# Patient Record
Sex: Female | Born: 1985
Health system: Southern US, Community
[De-identification: ages and names within clinical notes are randomized; demographics above are authoritative.]

## PROBLEM LIST (undated history)

## (undated) DIAGNOSIS — E039 Hypothyroidism, unspecified: Secondary | ICD-10-CM

## (undated) DIAGNOSIS — E559 Vitamin D deficiency, unspecified: Secondary | ICD-10-CM

## (undated) DIAGNOSIS — A749 Chlamydial infection, unspecified: Secondary | ICD-10-CM

## (undated) DIAGNOSIS — D649 Anemia, unspecified: Secondary | ICD-10-CM

## (undated) DIAGNOSIS — N890 Mild vaginal dysplasia: Secondary | ICD-10-CM

## (undated) DIAGNOSIS — Z87411 Personal history of vaginal dysplasia: Secondary | ICD-10-CM

## (undated) DIAGNOSIS — E538 Deficiency of other specified B group vitamins: Secondary | ICD-10-CM

## (undated) DIAGNOSIS — A549 Gonococcal infection, unspecified: Secondary | ICD-10-CM

## (undated) DIAGNOSIS — Z8619 Personal history of other infectious and parasitic diseases: Secondary | ICD-10-CM

## (undated) DIAGNOSIS — Z8489 Family history of other specified conditions: Secondary | ICD-10-CM

## (undated) DIAGNOSIS — M25569 Pain in unspecified knee: Secondary | ICD-10-CM

## (undated) DIAGNOSIS — L301 Dyshidrosis [pompholyx]: Secondary | ICD-10-CM

## (undated) DIAGNOSIS — J3089 Other allergic rhinitis: Secondary | ICD-10-CM

## (undated) DIAGNOSIS — J302 Other seasonal allergic rhinitis: Secondary | ICD-10-CM

## (undated) HISTORY — DX: Hypothyroidism, unspecified: E03.9

## (undated) HISTORY — DX: Mild vaginal dysplasia: N89.0

## (undated) HISTORY — DX: Vitamin D deficiency, unspecified: E55.9

## (undated) HISTORY — DX: Deficiency of other specified B group vitamins: E53.8

## (undated) HISTORY — PX: REDUCTION MAMMAPLASTY: SUR839

## (undated) HISTORY — PX: ABDOMINOPLASTY: SUR9

## (undated) HISTORY — PX: CHOLECYSTECTOMY: SHX55

## (undated) HISTORY — DX: Dyshidrosis (pompholyx): L30.1

## (undated) HISTORY — DX: Gonococcal infection, unspecified: A54.9

## (undated) HISTORY — DX: Chlamydial infection, unspecified: A74.9

## (undated) HISTORY — DX: Pain in unspecified knee: M25.569

---
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
## 2005-04-29 ENCOUNTER — Other Ambulatory Visit: Admission: RE | Admit: 2005-04-29 | Discharge: 2005-04-29 | Payer: Self-pay | Admitting: Gynecology

## 2005-08-29 ENCOUNTER — Ambulatory Visit (HOSPITAL_BASED_OUTPATIENT_CLINIC_OR_DEPARTMENT_OTHER): Admission: RE | Admit: 2005-08-29 | Discharge: 2005-08-29 | Payer: Self-pay | Admitting: Gynecology

## 2005-12-16 ENCOUNTER — Other Ambulatory Visit: Admission: RE | Admit: 2005-12-16 | Discharge: 2005-12-16 | Payer: Self-pay | Admitting: Gynecology

## 2006-05-01 ENCOUNTER — Other Ambulatory Visit: Admission: RE | Admit: 2006-05-01 | Discharge: 2006-05-01 | Payer: Self-pay | Admitting: Gynecology

## 2006-07-16 ENCOUNTER — Inpatient Hospital Stay (HOSPITAL_COMMUNITY): Admission: AD | Admit: 2006-07-16 | Discharge: 2006-07-16 | Payer: Self-pay | Admitting: Gynecology

## 2006-12-01 ENCOUNTER — Inpatient Hospital Stay (HOSPITAL_COMMUNITY): Admission: AD | Admit: 2006-12-01 | Discharge: 2006-12-01 | Payer: Self-pay | Admitting: Obstetrics & Gynecology

## 2006-12-02 ENCOUNTER — Ambulatory Visit: Payer: Self-pay | Admitting: Obstetrics and Gynecology

## 2006-12-30 HISTORY — PX: OTHER SURGICAL HISTORY: SHX169

## 2007-06-20 ENCOUNTER — Emergency Department (HOSPITAL_COMMUNITY): Admission: EM | Admit: 2007-06-20 | Discharge: 2007-06-21 | Payer: Self-pay | Admitting: Emergency Medicine

## 2007-06-22 ENCOUNTER — Emergency Department (HOSPITAL_COMMUNITY): Admission: EM | Admit: 2007-06-22 | Discharge: 2007-06-22 | Payer: Self-pay | Admitting: Emergency Medicine

## 2007-08-05 ENCOUNTER — Other Ambulatory Visit: Admission: RE | Admit: 2007-08-05 | Discharge: 2007-08-05 | Payer: Self-pay | Admitting: Gynecology

## 2007-08-30 HISTORY — PX: OTHER SURGICAL HISTORY: SHX169

## 2008-08-10 ENCOUNTER — Other Ambulatory Visit: Admission: RE | Admit: 2008-08-10 | Discharge: 2008-08-10 | Payer: Self-pay | Admitting: Gynecology

## 2008-10-06 ENCOUNTER — Ambulatory Visit: Payer: Self-pay | Admitting: Women's Health

## 2008-11-01 ENCOUNTER — Ambulatory Visit: Payer: Self-pay | Admitting: Gynecology

## 2008-11-15 ENCOUNTER — Ambulatory Visit: Payer: Self-pay | Admitting: Gynecology

## 2008-12-30 HISTORY — PX: REDUCTION MAMMAPLASTY: SUR839

## 2009-02-03 ENCOUNTER — Ambulatory Visit: Payer: Self-pay | Admitting: Women's Health

## 2009-02-15 ENCOUNTER — Ambulatory Visit: Payer: Self-pay | Admitting: Women's Health

## 2009-03-20 ENCOUNTER — Encounter: Admission: RE | Admit: 2009-03-20 | Discharge: 2009-03-20 | Payer: Self-pay | Admitting: Family Medicine

## 2009-04-09 ENCOUNTER — Emergency Department (HOSPITAL_COMMUNITY): Admission: EM | Admit: 2009-04-09 | Discharge: 2009-04-09 | Payer: Self-pay | Admitting: Emergency Medicine

## 2009-04-23 ENCOUNTER — Emergency Department (HOSPITAL_COMMUNITY): Admission: EM | Admit: 2009-04-23 | Discharge: 2009-04-23 | Payer: Self-pay | Admitting: Emergency Medicine

## 2009-04-27 ENCOUNTER — Ambulatory Visit: Payer: Self-pay | Admitting: Gynecology

## 2009-05-17 ENCOUNTER — Encounter (INDEPENDENT_AMBULATORY_CARE_PROVIDER_SITE_OTHER): Payer: Self-pay | Admitting: General Surgery

## 2009-05-17 ENCOUNTER — Ambulatory Visit (HOSPITAL_COMMUNITY): Admission: RE | Admit: 2009-05-17 | Discharge: 2009-05-17 | Payer: Self-pay | Admitting: General Surgery

## 2009-05-17 HISTORY — PX: CHOLECYSTECTOMY, LAPAROSCOPIC: SHX56

## 2009-06-30 ENCOUNTER — Ambulatory Visit: Payer: Self-pay | Admitting: Gynecology

## 2009-09-25 ENCOUNTER — Ambulatory Visit: Payer: Self-pay | Admitting: Gynecology

## 2009-09-25 ENCOUNTER — Other Ambulatory Visit: Admission: RE | Admit: 2009-09-25 | Discharge: 2009-09-25 | Payer: Self-pay | Admitting: Gynecology

## 2009-09-25 ENCOUNTER — Encounter: Payer: Self-pay | Admitting: Gynecology

## 2009-11-08 ENCOUNTER — Ambulatory Visit: Payer: Self-pay | Admitting: Gynecology

## 2010-01-10 ENCOUNTER — Ambulatory Visit: Payer: Self-pay | Admitting: Women's Health

## 2010-04-11 ENCOUNTER — Ambulatory Visit: Payer: Self-pay | Admitting: Women's Health

## 2010-06-07 ENCOUNTER — Ambulatory Visit: Payer: Self-pay | Admitting: Women's Health

## 2010-07-13 ENCOUNTER — Ambulatory Visit: Payer: Self-pay | Admitting: Women's Health

## 2010-08-30 ENCOUNTER — Ambulatory Visit: Payer: Self-pay | Admitting: Women's Health

## 2010-12-13 ENCOUNTER — Other Ambulatory Visit
Admission: RE | Admit: 2010-12-13 | Discharge: 2010-12-13 | Payer: Self-pay | Source: Home / Self Care | Admitting: Gynecology

## 2010-12-13 ENCOUNTER — Ambulatory Visit: Payer: Self-pay | Admitting: Women's Health

## 2011-01-31 ENCOUNTER — Ambulatory Visit (INDEPENDENT_AMBULATORY_CARE_PROVIDER_SITE_OTHER): Payer: 59 | Admitting: Women's Health

## 2011-01-31 DIAGNOSIS — Z3049 Encounter for surveillance of other contraceptives: Secondary | ICD-10-CM

## 2011-02-21 ENCOUNTER — Ambulatory Visit (INDEPENDENT_AMBULATORY_CARE_PROVIDER_SITE_OTHER): Payer: 59 | Admitting: Gynecology

## 2011-02-21 DIAGNOSIS — Z3046 Encounter for surveillance of implantable subdermal contraceptive: Secondary | ICD-10-CM

## 2011-02-21 DIAGNOSIS — Z30431 Encounter for routine checking of intrauterine contraceptive device: Secondary | ICD-10-CM

## 2011-03-11 ENCOUNTER — Ambulatory Visit (INDEPENDENT_AMBULATORY_CARE_PROVIDER_SITE_OTHER): Payer: 59 | Admitting: Women's Health

## 2011-03-11 DIAGNOSIS — B373 Candidiasis of vulva and vagina: Secondary | ICD-10-CM

## 2011-03-11 DIAGNOSIS — N898 Other specified noninflammatory disorders of vagina: Secondary | ICD-10-CM

## 2011-03-21 ENCOUNTER — Ambulatory Visit: Payer: 59 | Admitting: Gynecology

## 2011-04-09 LAB — CBC
Hemoglobin: 13 g/dL (ref 12.0–15.0)
MCHC: 33.4 g/dL (ref 30.0–36.0)
RDW: 13.6 % (ref 11.5–15.5)
WBC: 7.1 10*3/uL (ref 4.0–10.5)

## 2011-04-09 LAB — COMPREHENSIVE METABOLIC PANEL
ALT: 15 U/L (ref 0–35)
AST: 19 U/L (ref 0–37)
Alkaline Phosphatase: 63 U/L (ref 39–117)
Calcium: 9.4 mg/dL (ref 8.4–10.5)
Chloride: 110 mEq/L (ref 96–112)
GFR calc Af Amer: >60 mL/min (ref >60)
GFR calc non Af Amer: >60 mL/min (ref >60)
Total Protein: 7.3 g/dL (ref 6.0–8.3)

## 2011-04-09 LAB — DIFFERENTIAL
Basophils Relative: 0 % (ref 0–1)
Eosinophils Absolute: 0.1 10*3/uL (ref 0.0–0.7)
Lymphocytes Relative: 31 % (ref 12–46)
Monocytes Absolute: 0.4 10*3/uL (ref 0.1–1.0)
Monocytes Relative: 6 % (ref 3–12)
Neutrophils Relative %: 60 % (ref 43–77)

## 2011-04-09 LAB — HCG, SERUM, QUALITATIVE: Preg, Serum: NEGATIVE

## 2011-04-10 LAB — URINE MICROSCOPIC-ADD ON

## 2011-04-10 LAB — POCT PREGNANCY, URINE: Preg Test, Ur: NEGATIVE

## 2011-04-10 LAB — COMPREHENSIVE METABOLIC PANEL
ALT: 16 U/L (ref 0–35)
ALT: 12 U/L (ref 0–35)
AST: 17 U/L (ref 0–37)
Alkaline Phosphatase: 80 U/L (ref 39–117)
Alkaline Phosphatase: 75 U/L (ref 39–117)
CO2: 26 mEq/L (ref 19–32)
CO2: 24 mEq/L (ref 19–32)
Calcium: 9.6 mg/dL (ref 8.4–10.5)
Chloride: 106 mEq/L (ref 96–112)
Chloride: 107 mEq/L (ref 96–112)
Creatinine, Ser: 0.54 mg/dL (ref 0.4–1.2)
GFR calc Af Amer: >60 mL/min (ref >60)
GFR calc Af Amer: >60 mL/min (ref >60)
GFR calc non Af Amer: >60 mL/min (ref >60)
GFR calc non Af Amer: >60 mL/min (ref >60)
Glucose, Bld: 93 mg/dL (ref 70–99)
Sodium: 142 mEq/L (ref 135–145)
Total Bilirubin: 0.6 mg/dL (ref 0.3–1.2)
Total Bilirubin: 0.5 mg/dL (ref 0.3–1.2)

## 2011-04-10 LAB — CBC
HCT: 41.7 % (ref 36.0–46.0)
Hemoglobin: 13.8 g/dL (ref 12.0–15.0)
MCHC: 33.1 g/dL (ref 30.0–36.0)
MCV: 84.3 fL (ref 78.0–100.0)
Platelets: 190 10*3/uL (ref 150–400)
RBC: 4.95 MIL/uL (ref 3.87–5.11)
WBC: 12 10*3/uL — ABNORMAL HIGH (ref 4.0–10.5)

## 2011-04-10 LAB — DIFFERENTIAL
Basophils Absolute: 0 10*3/uL (ref 0.0–0.1)
Basophils Relative: 0 % (ref 0–1)
Eosinophils Relative: 0 % (ref 0–5)
Lymphocytes Relative: 13 % (ref 12–46)
Monocytes Absolute: 0.7 10*3/uL (ref 0.1–1.0)
Monocytes Relative: 6 % (ref 3–12)

## 2011-04-10 LAB — URINALYSIS, ROUTINE W REFLEX MICROSCOPIC
Hgb urine dipstick: NEGATIVE
Ketones, ur: NEGATIVE mg/dL
Protein, ur: NEGATIVE mg/dL
Urobilinogen, UA: 0.2 mg/dL (ref 0.0–1.0)

## 2011-04-10 LAB — LIPASE, BLOOD: Lipase: 26 U/L (ref 11–59)

## 2011-05-14 NOTE — Op Note (Signed)
NAMESELIN, EISLER NO.:  [id number]   MEDICAL RECORD NO.:  [id number]          PATIENT TYPE:  AMB   LOCATION:  SDS                          FACILITY:  [facility]   PHYSICIAN:  Almond Lint, MD       DATE OF BIRTH:  Oct 10, 1986   DATE OF PROCEDURE:  05/17/2009  DATE OF DISCHARGE:                               OPERATIVE REPORT   PREOPERATIVE DIAGNOSIS:  Symptomatic cholelithiasis.   POSTOPERATIVE DIAGNOSIS:  Symptomatic cholelithiasis.   PROCEDURE PERFORMED:  Laparoscopic cholecystectomy with intraoperative  cholangiogram.   SURGEON:  Almond Lint, MD   ASSISTANT:  None.   ANESTHESIA:  General and local.   FINDINGS:  Adhesions in the gallbladder.  Normal cholangiogram.  Small  amount of gallbladder wall edema.   SPECIMENS:  Gallbladder to pathology.   ESTIMATED BLOOD LOSS:  Minimal.   COMPLICATIONS:  None known.   PROCEDURE IN DETAIL:  Ms. Schnelle was identified in the holding area  and taken to the operating room where she was placed supine on the  operating room table.  General anesthesia was induced.  The patient's  abdomen was prepped and draped in a sterile fashion.  The time-out was  performed according to the surgical safety check list.  When all was  correct we continued.  The infraumbilical skin was anesthetized using a  mixture of 1% lidocaine plain and 0.25% Marcaine with epinephrine.  A  curvilinear incision was made with an #11 blade beneath the umbilicus.  Subcutaneous skin was spread with the Kelly clamp.  A Kocher was used to  elevate the umbilical stalk.  The fascia was entered sharply with a #11  blade.  The peritoneum was not yet accessed as the posterior fascia  pulled away from the anterior fascia.  This was then elevated with  Kochers on either side of the midline and the subcutaneous tissue was  divided with Metzenbaum scissors.  The fascia was then entered in the  midline posteriorly and a Kelly clamp used to confirm entrance into  the  peritoneum.  The fascial incision had a purse-string suture placed  around it.  The Hasson was advanced into the peritoneal cavity and  pneumoperitoneum was achieved to a pressure of 15 mmHg.  The camera was  introduced into the abdomen and the area just under the Hasson was  examined closely for any signs of injury and there were none.  The  patient was then placed into reverse Trendelenburg position and rotated  to the left.  The epigastrium was anesthetized in the midline with local  anesthetic.  A 1.2 cm incision was made with #11 blade and a 11 port  placed under direct visualization to the right of falciform.  Two 5-mm  ports were placed in similar fashion in the right upper quadrant.  The  gallbladder fundus was retracted toward the head.  There were  significant omental adhesions to the gallbladder and these were taken  down with the [facility] dissector bluntly and with cautery.  The  gallbladder fundus was elevated more toward the head and the gallbladder  infundibulum retracted  laterally.  The cystic duct was skeletonized with  the [place].  The cystic artery appeared to overlie the  cystic duct exactly.  This was dissected free with a right angle clamp  and clipped and transected.  The cystic duct was slightly larger than  anticipated and so a cholangiogram was performed.   The gallbladder was clipped proximally and the duct was incised with the  Endo shears.  The catheter was advanced through the abdominal wall and  was attempted to be placed into the ductotomy.  Unfortunately, the angle  of the catheter prohibited placement into the cystic duct, so this was  placed more toward the head for a more direct course.  The catheter was  advanced into the ductotomy and clipped into place.  It was able to be  flushed.  The pneumoperitoneum was evacuated and the patient was turned  to supine position.  Cholangiogram was shot with a mixture of 50%  Hypaque.  This  demonstrated good filling of the right and left hepatic  ducts and filling of the common bile duct to the duodenum with no signs  of filling defects.  The patient was returned to reverse Trendelenburg  position and rotated to the left and pneumoperitoneum was retrieved.  The catheter was removed from the duct and the cystic duct was clipped 3  times on the patient's side and once on the specimen side.   There was residual vascular structure higher up toward the liver and  this was dissected free and left in place.  The hook electrocautery was  used to remove the gallbladder from the gallbladder fossa.  Once this  was complete, the EndoCatch bag was placed through the umbilical  incision and the camera moved to the epigastric incision.  Given the  difficulty with placement of the Hasson into the abdomen this was left  in the bag until the gallbladder fossa was examined for hemostasis.  There was hemostasis and the small amount of clot that was there was  evacuated.  Clips were examined.  There was no sign of bleeding or  biliary leakage.  The attention was then redirected to the Hasson and  the Hasson was removed along with the EndoCatch bag with the  gallbladder.  Under direct visualization, the umbilical port was closed.  There was no air leakage from this site and the fascia appeared to be  intact with no additional defect.  The right upper quadrant ports were  removed under direct visualization and the pneumoperitoneum was allowed  to evacuate through the epigastric and the port.  Once the  pneumoperitoneum had evacuated completely, the epigastric port was  removed.  The incisions were then closed using 4-0 Monocryl in a  subcuticular fashion.  The patient tolerated the procedure well.  Needle  and sponge counts were correct x2.  The wounds were then cleaned, dried,  and dressed with Dermabond.  The patient was taken to PACU in stable  condition.      Almond Lint, MD   Electronically Signed     FB/MEDQ  D:  05/17/2009  T:  05/17/2009  Job:  [id number]

## 2011-05-17 NOTE — H&P (Signed)
[name], Morrow NO.:  [id number]   MEDICAL RECORD NO.:  [id number]          PATIENT TYPE:  AMB   LOCATION:  NESC                         FACILITY:  [facility]   PHYSICIAN:  Juan H. Lily Peer, M.D.DATE OF BIRTH:  1986-06-13   DATE OF ADMISSION:  08/29/2005  DATE OF DISCHARGE:                                HISTORY & PHYSICAL   Patient is scheduled for surgery tomorrow, August 31, at 8:00 a.m. at [facility].   CHIEF COMPLAINT:  1.  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  2.  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   HISTORY:  The patient is an 25 year old who was seen for her annual  gynecological examination on May 1, and her Pap smear had demonstrated that  she had CIN I/VAIN I with HPV changes in a few cells.  She subsequently  underwent detailed culposcopic evaluation on July 25th, which had  demonstrated that the ectocervix at the 11 o'clock position and 1 o'clock  position respectively had CIN I.  Also, the right vaginal fornix was  biopsied for a suspicious lesion, and the path report was VAIN I.  The ECC  was benign.  The patient has been presented with different options, and she  will be taken to the operating room for an outpatient setting under  anesthesia to be able to do a laser ablation of the cervical and vaginal  dysplasia.   PAST MEDICAL HISTORY:  The patient is not taking any medications right now.  She had been on Depo-Provera contraception in the past and then also the  Ortho-Ever patch but does not use any form of contraception.   Her blood work on August 28th demonstrated a negative serum pregnancy test.  She had a normal TSH, which was done because she had skipped some periods.  Her urinalysis had demonstrated 100,000 pure colony-count staph, and she was  started on Macrobid b.i.d. for a 7 day course.   The patient denies any allergies.  No other medical problems.   PHYSICAL EXAMINATION:  VITAL SIGNS:   Patient weighs 176 pounds.  Height 5  feet tall.  Blood pressure 120/78.  HEENT:  Unremarkable.  NECK:  Supple.  Trachea is midline.  No carotid bruits.  No thyromegaly.  LUNGS:  Clear to auscultation with no rales, rhonchi or wheezes.  HEART:  Regular rate and rhythm with no murmurs or gallops.  BREASTS:  Not done.  ABDOMEN:  Soft and nontender with no rebound or guarding.  PELVIC:  Bartholin's, urethra, and Skene's glands within normal limits.  Vagina and cervix as described above.  EXTREMITIES:  No cords.  No edema.   ASSESSMENT:  An 25 year old gravida 0 with a history of an abnormal Pap  smear.  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  Benign endocervical  mucosa.  Patient was counseled as to  the risks, benefits and pro's and con's  of laser surgery, the risks of infection, trauma as a result of the laser  beam were discussed.  The patient understands that she will need to be  followed up in the office for a two weeks postop visit and then three months  later for a follow-up Pap smear and perhaps every six months for a few years  to make sure there is no recurrence of her dysplasia.  Also, on follow-up  Pap smear, we will do a hypercapture of the HPV virus.  We had also  discussed above the HPV vaccine, and she was to let me know when we see her  in the hospital or when she comes for a postop visit, so that she can  receive it.   PLAN:  As per assessment above.      Juan H. Lily Peer, M.D.  Electronically Signed     JHF/MEDQ  D:  08/28/2005  T:  08/28/2005  Job:  [id number]

## 2011-05-17 NOTE — Op Note (Signed)
NAMESELICIA, [name] NO.:  [id number]   MEDICAL RECORD NO.:  [id number]          PATIENT TYPE:  AMB   LOCATION:  NESC                         FACILITY:  [facility]   PHYSICIAN:  Juan H. Lily Peer, M.D.DATE OF BIRTH:  01-Nov-1986   DATE OF PROCEDURE:  08/29/2005  DATE OF DISCHARGE:                                 OPERATIVE REPORT   SURGEON:  Juan H. Lily Peer, M.D.   INDICATIONS FOR OPERATION:  An 25 year old with CIN 1 of the ectocervix at  the 11 and 1 o'clock position and also VAIN 1 of the right vaginal fornix   PREOPERATIVE DIAGNOSIS:  1.  CIN 1 of the ectocervix.  2.  VAIN 1 of the right vaginal fornix.   POSTOPERATIVE DIAGNOSES:  1.  CIN  1 of the ectocervix.  2.  VAIN 1 of the right vaginal fornix.   ANESTHESIA:  MAC and intravenous sedation.   FINDINGS:  As described above.   DESCRIPTION OF OPERATION:  After the patient was adequately counseled, she  was taken to the operating room where she received intravenous sedation. A  coated titanium speculum was inserted into the vaginal vault and the vagina,  cervix and fornices were cleansed with acetic acid solution for enhancement  of the previously seen lesions in the office where the colposcopic directed  biopsies had been made. With the CO2 laser set at 8 watts continuous mode, a  circumferential marking of the area to be lasered in the ectocervix was  delineated. In a brush like fashion, the ectocervix was ablated to the level  of 2-3 mm in depth and bypassing the margins of the area that were  previously biopsied on the ectocervix. The right vaginal fornix from 9 to 12  o'clock was ablated in a similar fashion in a brush-like technique but to a  depth of about 2 mm. After completion of the operation, Silvadene cream was  applied intravaginally, the patient was transferred to the recovery room  with stable vital signs. She was given Toradol 30 mg IV. Blood loss was  minimal and fluid resuscitation  consisted of 900 mL of lactated Ringer's.      Juan H. Lily Peer, M.D.  Electronically Signed     JHF/MEDQ  D:  08/29/2005  T:  08/29/2005  Job:  [id number]

## 2011-07-18 ENCOUNTER — Ambulatory Visit (INDEPENDENT_AMBULATORY_CARE_PROVIDER_SITE_OTHER): Payer: 59 | Admitting: Women's Health

## 2011-07-18 DIAGNOSIS — R82998 Other abnormal findings in urine: Secondary | ICD-10-CM

## 2011-07-18 DIAGNOSIS — N390 Urinary tract infection, site not specified: Secondary | ICD-10-CM

## 2011-12-18 ENCOUNTER — Ambulatory Visit (INDEPENDENT_AMBULATORY_CARE_PROVIDER_SITE_OTHER): Payer: 59 | Admitting: Women's Health

## 2011-12-18 ENCOUNTER — Other Ambulatory Visit (HOSPITAL_COMMUNITY)
Admission: RE | Admit: 2011-12-18 | Discharge: 2011-12-18 | Disposition: A | Discharge status: Home / Self Care | Payer: 59 | Source: Ambulatory Visit | Attending: Women's Health | Admitting: Women's Health

## 2011-12-18 ENCOUNTER — Encounter: Payer: Self-pay | Admitting: Women's Health

## 2011-12-18 VITALS — BP 110/72 | Ht 63.0 in | Wt 176.0 lb

## 2011-12-18 DIAGNOSIS — Z01419 Encounter for gynecological examination (general) (routine) without abnormal findings: Secondary | ICD-10-CM

## 2011-12-18 DIAGNOSIS — Z833 Family history of diabetes mellitus: Secondary | ICD-10-CM

## 2011-12-18 DIAGNOSIS — R823 Hemoglobinuria: Secondary | ICD-10-CM

## 2011-12-18 NOTE — Progress Notes (Signed)
Brittany Morrow 24-Jun-1986 [id number]    History:    The patient presents for annual exam. Mirena IUD, 5 day cycle every 1-2 months. No complaints. Gardasil completed in 07. History of CIN-1 with laser 2006, normal Paps after.  Past medical history, past surgical history, family history and social history were all reviewed and documented in the EPIC chart. History of positive Chlamydia and 02 and 05. Father died of complications of diabetes at age 25.   ROS:  A  ROS was performed and pertinent positives and negatives are included in the history.  Exam:  Filed Vitals:   12/18/11 0942  BP: 110/72    General appearance:  Normal Head/Neck:  Normal, without cervical or supraclavicular adenopathy. Thyroid:  Symmetrical, normal in size, without palpable masses or nodularity. Respiratory  Effort:  Normal  Auscultation:  Clear without wheezing or rhonchi Cardiovascular  Auscultation:  Regular rate, without rubs, murmurs or gallops  Edema/varicosities:  Not grossly evident Abdominal  Soft,nontender, without masses, guarding or rebound.  Liver/spleen:  No organomegaly noted  Hernia:  None appreciated  Skin  Inspection:  Grossly normal  Palpation:  Grossly normal Neurologic/psychiatric  Orientation:  Normal with appropriate conversation.  Mood/affect:  Normal  Genitourinary    Breasts: Examined lying and sitting.     Right: Without masses, retractions, discharge or axillary adenopathy.     Left: Without masses, retractions, discharge or axillary adenopathy.   Inguinal/mons:  Normal without inguinal adenopathy  External genitalia:  Normal  BUS/Urethra/Skene's glands:  Normal  Bladder:  Normal  Vagina:  Normal  Cervix:  Normal/IUD string visible  Uterus:   normal in size, shape and contour.  Midline and mobile  Adnexa/parametria:     Rt: Without masses or tenderness.   Lt: Without masses or tenderness.  Anus and perineum: Normal  Digital rectal exam: Normal sphincter tone  without palpated masses or tenderness  Assessment/Plan:  25 y.o. SHF G1P1 for annual exam.  History of CIN-1/laser treatment 2006/ normal Paps after. Mirena IUD placed 01/2011   Plan: CBC, glucose, UA and Pap. SBEs, exercise, MVI daily encouraged. Aware IUD is good for 5 years. Is not planning on of pregnancy at this time.    Harrington Challenger [facility], 2:23 PM 12/18/2011

## 2011-12-19 LAB — GLUCOSE, RANDOM: Glucose, Bld: 88 mg/dL (ref 70–99)

## 2012-07-31 ENCOUNTER — Encounter: Payer: Self-pay | Admitting: Women's Health

## 2012-07-31 ENCOUNTER — Ambulatory Visit (INDEPENDENT_AMBULATORY_CARE_PROVIDER_SITE_OTHER): Payer: 59 | Admitting: Women's Health

## 2012-07-31 DIAGNOSIS — N760 Acute vaginitis: Secondary | ICD-10-CM

## 2012-07-31 DIAGNOSIS — B9689 Other specified bacterial agents as the cause of diseases classified elsewhere: Secondary | ICD-10-CM

## 2012-07-31 DIAGNOSIS — A499 Bacterial infection, unspecified: Secondary | ICD-10-CM

## 2012-07-31 DIAGNOSIS — Z87410 Personal history of cervical dysplasia: Secondary | ICD-10-CM

## 2012-07-31 DIAGNOSIS — N898 Other specified noninflammatory disorders of vagina: Secondary | ICD-10-CM

## 2012-07-31 DIAGNOSIS — N870 Mild cervical dysplasia: Secondary | ICD-10-CM

## 2012-07-31 HISTORY — DX: Personal history of cervical dysplasia: Z87.410

## 2012-07-31 LAB — WET PREP FOR TRICH, YEAST, CLUE: Yeast Wet Prep HPF POC: NONE SEEN

## 2012-07-31 MED ORDER — METRONIDAZOLE 500 MG PO TABS: 500.0000 mg | ORAL_TABLET | Freq: Two times a day (BID) | ORAL | Status: AC

## 2012-07-31 NOTE — Patient Instructions (Addendum)
Bacterial Vaginosis Bacterial vaginosis (BV) is a vaginal infection where the normal balance of bacteria in the vagina is disrupted. The normal balance is then replaced by an overgrowth of certain bacteria. There are several different kinds of bacteria that can cause BV. BV is the most common vaginal infection in women of childbearing age. CAUSES   The cause of BV is not fully understood. BV develops when there is an increase or imbalance of harmful bacteria.   Some activities or behaviors can upset the normal balance of bacteria in the vagina and put women at increased risk including:   Having a new sex partner or multiple sex partners.   Douching.   Using an intrauterine device (IUD) for contraception.   It is not clear what role sexual activity plays in the development of BV. However, women that have never had sexual intercourse are rarely infected with BV.  Women do not get BV from toilet seats, bedding, swimming pools or from touching objects around them.  SYMPTOMS   Grey vaginal discharge.   A fish-like odor with discharge, especially after sexual intercourse.   Itching or burning of the vagina and vulva.   Burning or pain with urination.   Some women have no signs or symptoms at all.  DIAGNOSIS  Your caregiver must examine the vagina for signs of BV. Your caregiver will perform lab tests and look at the sample of vaginal fluid through a microscope. They will look for bacteria and abnormal cells (clue cells), a pH test higher than 4.5, and a positive amine test all associated with BV.  RISKS AND COMPLICATIONS   Pelvic inflammatory disease (PID).   Infections following gynecology surgery.   Developing HIV.   Developing herpes virus.  TREATMENT  Sometimes BV will clear up without treatment. However, all women with symptoms of BV should be treated to avoid complications, especially if gynecology surgery is planned. Female partners generally do not need to be treated. However,  BV may spread between female sex partners so treatment is helpful in preventing a recurrence of BV.   BV may be treated with antibiotics. The antibiotics come in either pill or vaginal cream forms. Either can be used with nonpregnant or pregnant women, but the recommended dosages differ. These antibiotics are not harmful to the baby.   BV can recur after treatment. If this happens, a second round of antibiotics will often be prescribed.   Treatment is important for pregnant women. If not treated, BV can cause a premature delivery, especially for a pregnant woman who had a premature birth in the past. All pregnant women who have symptoms of BV should be checked and treated.   For chronic reoccurrence of BV, treatment with a type of prescribed gel vaginally twice a week is helpful.  HOME CARE INSTRUCTIONS   Finish all medication as directed by your caregiver.   Do not have sex until treatment is completed.   Tell your sexual partner that you have a vaginal infection. They should see their caregiver and be treated if they have problems, such as a mild rash or itching.   Practice safe sex. Use condoms. Only have 1 sex partner.  PREVENTION  Basic prevention steps can help reduce the risk of upsetting the natural balance of bacteria in the vagina and developing BV:  Do not have sexual intercourse (be abstinent).   Do not douche.   Use all of the medicine prescribed for treatment of BV, even if the signs and symptoms go away.     Tell your sex partner if you have BV. That way, they can be treated, if needed, to prevent reoccurrence.  SEEK MEDICAL CARE IF:   Your symptoms are not improving after 3 days of treatment.   You have increased discharge, pain, or fever.  MAKE SURE YOU:   Understand these instructions.   Will watch your condition.   Will get help right away if you are not doing well or get worse.  FOR MORE INFORMATION  Division of STD Prevention (DSTDP), Centers for Disease  Control and Prevention: www.cdc.gov/std American Social Health Association (ASHA): www.ashastd.org  Document Released: 12/16/2005 Document Revised: 12/05/2011 Document Reviewed: 06/08/2009 ExitCare Patient Information 2012 [employer]. 

## 2012-07-31 NOTE — Progress Notes (Signed)
Patient ID: Brittany Morrow, female   DOB: [birth year], 26 y.o.   MRN: [id number] Presents with complaint of vaginal discharge with an odor intermittently for 2 weeks. Irregular cycles/ Mirena IUD that was placed 01/2011. Same partner, denies urinary symptoms, fever or other problems.  Exam: External genitalia within normal limits, speculum exam, IUD strings visible, moderate amount of a pink discharge noted with an odor. Wet prep positive for amines, clues and TNTC bacteria. Bimanual no CMT or adnexal fullness or tenderness.  BV  Plan: Flagyl 500 by mouth twice a day for 7 days #14, alcohol precautions reviewed. Instructed to call if no relief of symptoms.

## 2012-12-30 DIAGNOSIS — A549 Gonococcal infection, unspecified: Secondary | ICD-10-CM

## 2012-12-30 DIAGNOSIS — Z8619 Personal history of other infectious and parasitic diseases: Secondary | ICD-10-CM

## 2012-12-30 HISTORY — DX: Gonococcal infection, unspecified: A54.9

## 2012-12-30 HISTORY — DX: Personal history of other infectious and parasitic diseases: Z86.19

## 2013-08-06 ENCOUNTER — Encounter: Payer: Self-pay | Admitting: Gynecology

## 2013-08-06 ENCOUNTER — Ambulatory Visit (INDEPENDENT_AMBULATORY_CARE_PROVIDER_SITE_OTHER): Payer: 59 | Admitting: Gynecology

## 2013-08-06 VITALS — BP 122/76 | Ht 62.75 in | Wt 177.0 lb

## 2013-08-06 DIAGNOSIS — Z01419 Encounter for gynecological examination (general) (routine) without abnormal findings: Secondary | ICD-10-CM

## 2013-08-06 DIAGNOSIS — Z8639 Personal history of other endocrine, nutritional and metabolic disease: Secondary | ICD-10-CM

## 2013-08-06 DIAGNOSIS — R635 Abnormal weight gain: Secondary | ICD-10-CM

## 2013-08-06 DIAGNOSIS — Z833 Family history of diabetes mellitus: Secondary | ICD-10-CM

## 2013-08-06 DIAGNOSIS — N3281 Overactive bladder: Secondary | ICD-10-CM

## 2013-08-06 DIAGNOSIS — Z87410 Personal history of cervical dysplasia: Secondary | ICD-10-CM

## 2013-08-06 DIAGNOSIS — N318 Other neuromuscular dysfunction of bladder: Secondary | ICD-10-CM

## 2013-08-06 DIAGNOSIS — N898 Other specified noninflammatory disorders of vagina: Secondary | ICD-10-CM

## 2013-08-06 LAB — CBC WITH DIFFERENTIAL/PLATELET
Basophils Absolute: 0 10*3/uL (ref 0.0–0.1)
Eosinophils Relative: 1 % (ref 0–5)
Lymphocytes Relative: 25 % (ref 12–46)
MCV: 82.7 fL (ref 78.0–100.0)
Neutrophils Relative %: 68 % (ref 43–77)
Platelets: 232 10*3/uL (ref 150–400)
RDW: 14.4 % (ref 11.5–15.5)
WBC: 7.6 10*3/uL (ref 4.0–10.5)

## 2013-08-06 LAB — WET PREP FOR TRICH, YEAST, CLUE

## 2013-08-06 MED ORDER — METRONIDAZOLE 500 MG PO TABS: 500.0000 mg | ORAL_TABLET | Freq: Two times a day (BID) | ORAL | Status: DC

## 2013-08-06 NOTE — Patient Instructions (Addendum)
Bacterial Vaginosis Bacterial vaginosis (BV) is a vaginal infection where the normal balance of bacteria in the vagina is disrupted. The normal balance is then replaced by an overgrowth of certain bacteria. There are several different kinds of bacteria that can cause BV. BV is the most common vaginal infection in women of childbearing age. CAUSES   The cause of BV is not fully understood. BV develops when there is an increase or imbalance of harmful bacteria.  Some activities or behaviors can upset the normal balance of bacteria in the vagina and put women at increased risk including:  Having a new sex partner or multiple sex partners.  Douching.  Using an intrauterine device (IUD) for contraception.  It is not clear what role sexual activity plays in the development of BV. However, women that have never had sexual intercourse are rarely infected with BV. Women do not get BV from toilet seats, bedding, swimming pools or from touching objects around them.  SYMPTOMS   Grey vaginal discharge.  A fish-like odor with discharge, especially after sexual intercourse.  Itching or burning of the vagina and vulva.  Burning or pain with urination.  Some women have no signs or symptoms at all. DIAGNOSIS  Your caregiver must examine the vagina for signs of BV. Your caregiver will perform lab tests and look at the sample of vaginal fluid through a microscope. They will look for bacteria and abnormal cells (clue cells), a pH test higher than 4.5, and a positive amine test all associated with BV.  RISKS AND COMPLICATIONS   Pelvic inflammatory disease (PID).  Infections following gynecology surgery.  Developing HIV.  Developing herpes virus. TREATMENT  Sometimes BV will clear up without treatment. However, all women with symptoms of BV should be treated to avoid complications, especially if gynecology surgery is planned. Female partners generally do not need to be treated. However, BV may spread  between female sex partners so treatment is helpful in preventing a recurrence of BV.   BV may be treated with antibiotics. The antibiotics come in either pill or vaginal cream forms. Either can be used with nonpregnant or pregnant women, but the recommended dosages differ. These antibiotics are not harmful to the baby.  BV can recur after treatment. If this happens, a second round of antibiotics will often be prescribed.  Treatment is important for pregnant women. If not treated, BV can cause a premature delivery, especially for a pregnant woman who had a premature birth in the past. All pregnant women who have symptoms of BV should be checked and treated.  For chronic reoccurrence of BV, treatment with a type of prescribed gel vaginally twice a week is helpful. HOME CARE INSTRUCTIONS   Finish all medication as directed by your caregiver.  Do not have sex until treatment is completed.  Tell your sexual partner that you have a vaginal infection. They should see their caregiver and be treated if they have problems, such as a mild rash or itching.  Practice safe sex. Use condoms. Only have 1 sex partner. PREVENTION  Basic prevention steps can help reduce the risk of upsetting the natural balance of bacteria in the vagina and developing BV:  Do not have sexual intercourse (be abstinent).  Do not douche.  Use all of the medicine prescribed for treatment of BV, even if the signs and symptoms go away.  Tell your sex partner if you have BV. That way, they can be treated, if needed, to prevent reoccurrence. SEEK MEDICAL CARE IF:     Your symptoms are not improving after 3 days of treatment.  You have increased discharge, pain, or fever. MAKE SURE YOU:   Understand these instructions.  Will watch your condition.  Will get help right away if you are not doing well or get worse. FOR MORE INFORMATION  Division of STD Prevention (DSTDP), Centers for Disease Control and Prevention:  www.cdc.gov/std American Social Health Association (ASHA): www.ashastd.org  Document Released: 12/16/2005 Document Revised: 03/09/2012 Document Reviewed: 06/08/2009 ExitCare Patient Information 2014 [employer].  

## 2013-08-06 NOTE — Progress Notes (Signed)
Brittany Morrow 25-Dec-1986 [id number]   History:    27 y.o.  for annual gyn exam with complaint of vaginal discharge as well as frequency and urgency but no dysuria. Patient denies any back pain, fever, chills, nausea, or vomiting. Patient had a Mirena IUD placed in 2012 and has done well. She has completed the Gardasil vaccine series. 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 Patient does her monthly breast exam. 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 She does have strong family history of diabetes. She also was complaining of increased weight despite attempts at exercising. She has had history of vitamin D deficiency in the past.  Past medical history,surgical history, family history and social history were all reviewed and documented in the EPIC chart.  Gynecologic History Patient's last menstrual period was 07/28/2013. Contraception: IUD Last Pap: 2013. Results were: normal Last mammogram: none indicated. Results were: none indicated  Obstetric History OB History   Grav Para Term Preterm Abortions TAB SAB Ect Mult Living   1 1        1      # Outc Date GA Lbr Len/2nd Wgt Sex Del Anes PTL Lv   1 PAR                ROS: A ROS was performed and pertinent positives and negatives are included in the history.  GENERAL: No fevers or chills. HEENT: No change in vision, no earache, sore throat or sinus congestion. NECK: No pain or stiffness. CARDIOVASCULAR: No chest pain or pressure. No palpitations. PULMONARY: No shortness of breath, cough or wheeze. GASTROINTESTINAL: No abdominal pain, nausea, vomiting or diarrhea, melena or bright red blood per rectum. GENITOURINARY: No urinary frequency, urgency, hesitancy or dysuria. MUSCULOSKELETAL: No joint or muscle pain, no back pain, no recent trauma. DERMATOLOGIC: No rash, no itching, no lesions. ENDOCRINE: No polyuria, polydipsia, no  heat or cold intolerance. No recent change in weight. HEMATOLOGICAL: No anemia or easy bruising or bleeding. NEUROLOGIC: No headache, seizures, numbness, tingling or weakness. PSYCHIATRIC: No depression, no loss of interest in normal activity or change in sleep pattern.   complaining of discharge with odor  Exam: chaperone present  BP 122/76  Ht 5' 2.75" (1.594 m)  Wt 177 lb (80.287 kg)  BMI 31.6 kg/m2  LMP 07/28/2013  Body mass index is 31.6 kg/(m^2).  General appearance : Well developed well nourished female. No acute distress HEENT: Neck supple, trachea midline, no carotid bruits, no thyroidmegaly Lungs: Clear to auscultation, no rhonchi or wheezes, or rib retractions  Heart: Regular rate and rhythm, no murmurs or gallops Breast:Examined in sitting and supine position were symmetrical in appearance, no palpable masses or tenderness,  no skin retraction, no nipple inversion, no nipple discharge, no skin discoloration, no axillary or supraclavicular lymphadenopathy Abdomen: no palpable masses or tenderness, no rebound or guarding Extremities: no edema or skin discoloration or tenderness  Pelvic:  Bartholin, Urethra, Skene Glands: Within normal limits             Vagina: No gross lesions or discharge: Clear fascia motor like discharge  Cervix: No gross lesions or discharge, IUD string seen  Uterus  anteverted, normal size, shape and consistency, non-tender and mobile  Adnexa  Without masses or tenderness  Anus and perineum  normal   Rectovaginal  normal sphincter tone without palpated masses or tenderness  Hemoccult that indicated   Wet prep: Pos Amine, many clue cells, moderate WBC, too numerous to count bacteria  Assessment/Plan:  27 y.o. female for annual exam with clinical evidence of bacterial vaginosis. Patient will be placed on Flagyl 500 mg one by mouth twice a day for 5 days. GC and chlamydia culture was obtained results pending at time of dictation. The  following labs were ordered today: CBC, hemoglobin A1c, TSH, vitamin D level, screening cholesterol, and urinalysis. No Pap smear done today the new guidelines were discussed.    Ok Edwards MD, 3:53 PM 08/06/2013

## 2013-08-07 LAB — URINALYSIS W MICROSCOPIC + REFLEX CULTURE
Bilirubin Urine: NEGATIVE
Glucose, UA: NEGATIVE mg/dL
Hgb urine dipstick: NEGATIVE
Leukocytes, UA: NEGATIVE
Protein, ur: NEGATIVE mg/dL

## 2013-08-07 LAB — HEMOGLOBIN A1C
Hgb A1c MFr Bld: 5.3 % (ref <5.7)
Mean Plasma Glucose: 105 mg/dL (ref <117)

## 2013-08-07 LAB — CHOLESTEROL, TOTAL: Cholesterol: 151 mg/dL (ref 0–200)

## 2013-08-08 LAB — GC/CHLAMYDIA PROBE AMP: GC Probe RNA: POSITIVE — AB

## 2013-08-09 ENCOUNTER — Other Ambulatory Visit: Payer: Self-pay | Admitting: Gynecology

## 2013-08-09 ENCOUNTER — Encounter: Payer: Self-pay | Admitting: Gynecology

## 2013-08-09 ENCOUNTER — Encounter: Payer: Self-pay | Admitting: Women's Health

## 2013-08-09 ENCOUNTER — Telehealth: Payer: Self-pay

## 2013-08-09 DIAGNOSIS — Z113 Encounter for screening for infections with a predominantly sexual mode of transmission: Secondary | ICD-10-CM

## 2013-08-09 MED ORDER — AZITHROMYCIN 500 MG PO TABS: 1000.0000 mg | ORAL_TABLET | Freq: Once | ORAL | Status: DC

## 2013-08-09 NOTE — Telephone Encounter (Signed)
After speaking with you Gearldine Bienenstock called me back and said you wanted me to call in another gram of Azithromycin for the patient.  I need to get the okay/order from you so that it is documented here that was your order.  Ok?

## 2013-08-09 NOTE — Telephone Encounter (Signed)
Azithromycin 2 grams PO. Thanks

## 2013-08-09 NOTE — Telephone Encounter (Signed)
Gearldine Bienenstock, [facility] Public Health Investigator called to say that what you have prescribed for patient (Azyithromycin one gram) will not treat gonorrhea. She said you either need to give her 250mg  of Rocephin IM or call in another Gram of Azithromycin to equal two grams total.  Please advise.

## 2013-08-10 ENCOUNTER — Other Ambulatory Visit: Payer: Self-pay | Admitting: Gynecology

## 2013-08-10 ENCOUNTER — Telehealth: Payer: Self-pay

## 2013-08-10 MED ORDER — AZITHROMYCIN 500 MG PO TABS: 1000.0000 mg | ORAL_TABLET | Freq: Once | ORAL | Status: DC

## 2013-08-10 NOTE — Telephone Encounter (Signed)
I called patient and informed her that Dr. Glenetta Hew wants her to have total 2 grams of Azithromycin.  She has already picked up the Rx we escribed yesterday.  I explained I had called another Rx in for her and she needs to pick it up and take it today.  I did apologize to her for any inconvenience caused to her. She was very understanding.

## 2013-08-10 NOTE — Telephone Encounter (Signed)
Patient informed of need for another gram. Rx escribed and Brandy at the [facility] Dept notified that patient advised and Rx in.

## 2013-09-07 ENCOUNTER — Ambulatory Visit: Payer: 59 | Admitting: Gynecology

## 2013-09-16 ENCOUNTER — Ambulatory Visit (INDEPENDENT_AMBULATORY_CARE_PROVIDER_SITE_OTHER): Payer: 59 | Admitting: Gynecology

## 2013-09-16 ENCOUNTER — Encounter: Payer: Self-pay | Admitting: Gynecology

## 2013-09-16 VITALS — BP 120/82

## 2013-09-16 DIAGNOSIS — Z113 Encounter for screening for infections with a predominantly sexual mode of transmission: Secondary | ICD-10-CM

## 2013-09-16 DIAGNOSIS — A5400 Gonococcal infection of lower genitourinary tract, unspecified: Secondary | ICD-10-CM

## 2013-09-16 DIAGNOSIS — A549 Gonococcal infection, unspecified: Secondary | ICD-10-CM | POA: Insufficient documentation

## 2013-09-16 LAB — WET PREP FOR TRICH, YEAST, CLUE
Clue Cells Wet Prep HPF POC: NONE SEEN
Trich, Wet Prep: NONE SEEN
Yeast Wet Prep HPF POC: NONE SEEN

## 2013-09-16 NOTE — Patient Instructions (Signed)
Gonorrhea Culture This is a test for Neisseria gonorrhoeae, the bacteria that causes the sexually transmitted disease gonorrhea. Gonorrhea is easily treated but can cause severe reproductive and other health problems if left untreated. A swab is used to get a sample of secretion or discharge from the infected area such as the cervix, urethra, penis, anus, or throat. A urine sample is used in some tests. Many caregivers will take a sample from more than one body site to increase the likelihood of finding the bacteria.  NORMAL FINDINGS Your culture should be negative. This means that this culture showed no growth for gonorrhea. Ranges for normal findings may vary among different laboratories and hospitals. You should always check with your doctor after having lab work or other tests done to discuss the meaning of your test results and whether your values are considered within normal limits. MEANING OF TEST  Your caregiver will go over the test results with you and discuss the importance and meaning of your results, as well as treatment options and the need for additional tests. OBTAINING THE TEST RESULTS It is your responsibility to obtain your test results. Ask the lab or department performing the test when and how you will get your results. Document Released: 01/17/2005 Document Revised: 03/09/2012 Document Reviewed: 11/26/2008 [facility] Patient Information 2014 [place], [place].  Gonorrhea, Females and Males Gonorrhea is an infection. Gonorrhea can be treated with medicines that kill germs (antibiotics). It is necessary that all your sexual partners also be tested for infection and possibly be treated.  CAUSES  Gonorrhea is caused by a germ (bacteria) called Neisseria gonorrhoeae. This infection is spread by sexual contact. The contact that spreads gonorrhea from person to person may be oral, anal, or genital sex. SYMPTOMS  Females A woman may have gonorrhea infection and no symptoms. The most  common symptoms are:  Pain in the lower abdomen.  Fever, with or without chills. When these are the most serious problems, the illness is commonly called pelvic inflammatory disease (PID). Other symptoms include:  Abnormal vaginal discharge.  Painful intercourse.  Burning or itching of the vagina or lips of the vagina.  Abnormal vaginal bleeding.  Pain when urinating. If the infection is spread by anal sex:  Irritation, pain, bleeding, or discharge from the rectum. If the infection is spread by oral sex with either a man or a woman:  Sore throat, fever, and swollen neck lymph glands. Other problems may include:  Long-lasting (chronic) pain in the lower abdomen during menstruation, intercourse, or at other times.  Inability to become pregnant.  Premature birth.  Passing the infection onto a newborn baby. This can cause an eye infection in the infant or more serious health problems. Males Less frequently than in women, men may have gonorrhea infection and no symptoms. The most common symptoms are:  Discharge from the penis.  Pain or burning during urination. If the infection is spread by anal sex:  Irritation, pain, bleeding, or discharge from the rectum. If the infection is spread by oral sex with either a man or a woman:  Sore throat, fever, and swollen neck lymph glands. DIAGNOSIS  Diagnosis is made by exam of the patient and checking a sample of discharge under a microscope for the presence of the bacteria. Discharge may be taken from the urethra, cervix, throat, or rectum. TREATMENT  It is important to diagnose and treat gonorrhea as soon as possible. This prevents damage to the female or female organs or harm to the newborn baby  of an infected woman.  Antibiotics are used to treat gonorrhea.  Your sex partners should also be examined and treated if needed.  Testing and treatment for other sexually transmitted diseases (STDs) may be done when you are diagnosed  with gonorrhea. Gonorrhea is an STD. You are at risk for other STDs, which are often transmitted around the same time as gonorrhea. These include:  Chlamydia.  Syphilis.  Trichomonas.  Human papillomavirus (HPV).  Human immunodeficiency virus (HIV).  If left untreated, PID can cause women to be unable to have children (sterile). To prevent sterility in females, it is important to be treated as soon as possible and finish all medicines. Unfortunately, sterility or pregnancy occurring outside the uterus (ectopic) may still occur in fully treated women. HOME CARE INSTRUCTIONS   Finish all medicine as prescribed. Incomplete treatment will put you at risk for continued infection.  Only take over-the-counter or prescription medicines for pain, discomfort, or fever as directed by your caregiver.  Do not have sex until treatment is completed, or as instructed by your caregiver.  Follow up with your caregiver as directed.  If you test positive for gonorrhea, inform your recent sexual partners. They may need an exam and treatment, even if they have no symptoms. They may need treatment even if they test negative for gonorrhea. Finding out the results of your test Not all test results are available during your visit. If your test results are not back during the visit, make an appointment with your caregiver to find out the results. Do not assume everything is normal if you have not heard from your caregiver or the medical facility. It is important for you to follow up on all of your test results. SEEK MEDICAL CARE IF:   You develop any bad reaction to the medicine you were prescribed. This may include:  Rash.  Nausea.  Vomiting.  Diarrhea.  You have an oral temperature above 102 F (38.9 C).  You have symptoms that do not improve, symptoms that get worse, or you develop increased pain. Males may get pain in the testicles and females may get increased abdominal pain. MAKE SURE YOU:    Understand these instructions.  Will watch your condition.  Will get help right away if you are not doing well or get worse. Document Released: 12/13/2000 Document Revised: 03/09/2012 Document Reviewed: 04/17/2010 [facility] Patient Information 2014 [place], [place].

## 2013-09-16 NOTE — Progress Notes (Signed)
The patient presented to the office today for test of cure due to the fact the time of her annual exam on August 8 she had a GC and Chlamydia culture obtained and her gonorrhea culture was positive. At that time she was treated for bacterial vaginosis because she was having a vaginal discharge. She was treated with azithromycin 1 g by mouth. Patient's partner has refused to be treated and they are separated. Patient states her partner states that he does not have any symptoms. Patient is asymptomatic otherwise today.  Exam: Brittany Morrow was within normal limits Vagina: Menstrual blood present Cervix: IUD string seen  Wet prep negative  GC and Chlamydia culture obtained resulting in time of this dictation  Assessment/plan: Patient recently treated for gonorrhea. Test of cure today results pending. Patient will stop by the lab to complete the STD screening consisting of the following: HIV, RPR, hep B and hep C. Once again she was reminded to instruct her partner to be treated.

## 2013-09-17 LAB — HEPATITIS C ANTIBODY: HCV Ab: NEGATIVE

## 2013-09-17 LAB — HIV ANTIBODY (ROUTINE TESTING W REFLEX): HIV: NONREACTIVE

## 2013-09-17 LAB — GC/CHLAMYDIA PROBE AMP
CT Probe RNA: NEGATIVE
GC Probe RNA: NEGATIVE

## 2013-09-23 ENCOUNTER — Ambulatory Visit (INDEPENDENT_AMBULATORY_CARE_PROVIDER_SITE_OTHER): Payer: 59 | Admitting: Women's Health

## 2013-09-23 ENCOUNTER — Encounter: Payer: Self-pay | Admitting: Women's Health

## 2013-09-23 DIAGNOSIS — R350 Frequency of micturition: Secondary | ICD-10-CM

## 2013-09-23 DIAGNOSIS — B9689 Other specified bacterial agents as the cause of diseases classified elsewhere: Secondary | ICD-10-CM

## 2013-09-23 DIAGNOSIS — N898 Other specified noninflammatory disorders of vagina: Secondary | ICD-10-CM

## 2013-09-23 DIAGNOSIS — IMO0001 Reserved for inherently not codable concepts without codable children: Secondary | ICD-10-CM

## 2013-09-23 DIAGNOSIS — N760 Acute vaginitis: Secondary | ICD-10-CM

## 2013-09-23 DIAGNOSIS — A499 Bacterial infection, unspecified: Secondary | ICD-10-CM

## 2013-09-23 LAB — WET PREP FOR TRICH, YEAST, CLUE: Yeast Wet Prep HPF POC: NONE SEEN

## 2013-09-23 MED ORDER — METRONIDAZOLE 0.75 % VA GEL: VAGINAL | Status: DC

## 2013-09-23 NOTE — Progress Notes (Signed)
Patient ID: Brittany Morrow, female   DOB: 21-Nov-1986, 27 y.o.   MRN: [id number] Presents with 3 days of vaginal discharge, odor, intermittent menstrual type cramping. Denies itching or urinary symptoms. Tested positive for GC on annual exam and was treated with 2g azithromycin. Negative test of cure one week ago 09/16/13. States that a few days after completing the treatment she began experiencing above symptoms. Partner states he was treated but she is unsure if he is telling the truth. Mirena/same partner for 10 years/father of 48 yo [place].  Exam: well appearing, normal external genitalia, speculum exam: Cervix without visible lesion, IUD strings noted .No erythema, adherent white discharge, wet prep: yeast none, amine positive, clue cells moderate, bacteria TNTC.  GC/Chlamydia culture taken. Bimanual no CMT or adnexal fullness or tenderness.  Bacterial vaginosis STD screen/recent gonorrhea infection, treated and negative test of cure 09/16/13.  Plan: Metronidazole gel 70 g once a day x5 days, proper use reviewed alcohol precautions. GC/Chlamydia culture pending. Recommended counseling.

## 2013-09-23 NOTE — Patient Instructions (Addendum)
Bacterial Vaginosis Bacterial vaginosis (BV) is a vaginal infection where the normal balance of bacteria in the vagina is disrupted. The normal balance is then replaced by an overgrowth of certain bacteria. There are several different kinds of bacteria that can cause BV. BV is the most common vaginal infection in women of childbearing age. CAUSES   The cause of BV is not fully understood. BV develops when there is an increase or imbalance of harmful bacteria.  Some activities or behaviors can upset the normal balance of bacteria in the vagina and put women at increased risk including:  Having a new sex partner or multiple sex partners.  Douching.  Using an intrauterine device (IUD) for contraception.  It is not clear what role sexual activity plays in the development of BV. However, women that have never had sexual intercourse are rarely infected with BV. Women do not get BV from toilet seats, bedding, swimming pools or from touching objects around them.  SYMPTOMS   Grey vaginal discharge.  A fish-like odor with discharge, especially after sexual intercourse.  Itching or burning of the vagina and vulva.  Burning or pain with urination.  Some women have no signs or symptoms at all. DIAGNOSIS  Your caregiver must examine the vagina for signs of BV. Your caregiver will perform lab tests and look at the sample of vaginal fluid through a microscope. They will look for bacteria and abnormal cells (clue cells), a pH test higher than 4.5, and a positive amine test all associated with BV.  RISKS AND COMPLICATIONS   Pelvic inflammatory disease (PID).  Infections following gynecology surgery.  Developing HIV.  Developing herpes virus. TREATMENT  Sometimes BV will clear up without treatment. However, all women with symptoms of BV should be treated to avoid complications, especially if gynecology surgery is planned. Female partners generally do not need to be treated. However, BV may spread  between female sex partners so treatment is helpful in preventing a recurrence of BV.   BV may be treated with antibiotics. The antibiotics come in either pill or vaginal cream forms. Either can be used with nonpregnant or pregnant women, but the recommended dosages differ. These antibiotics are not harmful to the baby.  BV can recur after treatment. If this happens, a second round of antibiotics will often be prescribed.  Treatment is important for pregnant women. If not treated, BV can cause a premature delivery, especially for a pregnant woman who had a premature birth in the past. All pregnant women who have symptoms of BV should be checked and treated.  For chronic reoccurrence of BV, treatment with a type of prescribed gel vaginally twice a week is helpful. HOME CARE INSTRUCTIONS   Finish all medication as directed by your caregiver.  Do not have sex until treatment is completed.  Tell your sexual partner that you have a vaginal infection. They should see their caregiver and be treated if they have problems, such as a mild rash or itching.  Practice safe sex. Use condoms. Only have 1 sex partner. PREVENTION  Basic prevention steps can help reduce the risk of upsetting the natural balance of bacteria in the vagina and developing BV:  Do not have sexual intercourse (be abstinent).  Do not douche.  Use all of the medicine prescribed for treatment of BV, even if the signs and symptoms go away.  Tell your sex partner if you have BV. That way, they can be treated, if needed, to prevent reoccurrence. SEEK MEDICAL CARE IF:     Your symptoms are not improving after 3 days of treatment.  You have increased discharge, pain, or fever. MAKE SURE YOU:   Understand these instructions.  Will watch your condition.  Will get help right away if you are not doing well or get worse. FOR MORE INFORMATION  Division of STD Prevention (DSTDP), Centers for Disease Control and Prevention:  www.cdc.gov/std American Social Health Association (ASHA): www.ashastd.org  Document Released: 12/16/2005 Document Revised: 03/09/2012 Document Reviewed: 06/08/2009 ExitCare Patient Information 2014 [employer].  

## 2013-09-24 LAB — URINALYSIS W MICROSCOPIC + REFLEX CULTURE
Crystals: NONE SEEN
Nitrite: NEGATIVE
Protein, ur: NEGATIVE mg/dL
Urobilinogen, UA: 0.2 mg/dL (ref 0.0–1.0)
pH: 6 (ref 5.0–8.0)

## 2013-09-24 LAB — GC/CHLAMYDIA PROBE AMP
CT Probe RNA: NEGATIVE
GC Probe RNA: NEGATIVE

## 2013-09-25 LAB — URINE CULTURE
Colony Count: NO GROWTH
Organism ID, Bacteria: NO GROWTH

## 2013-11-04 ENCOUNTER — Other Ambulatory Visit: Payer: Self-pay

## 2014-03-30 ENCOUNTER — Ambulatory Visit: Payer: 59 | Admitting: Gynecology

## 2014-10-31 ENCOUNTER — Encounter: Payer: Self-pay | Admitting: Women's Health

## 2015-07-22 ENCOUNTER — Telehealth: Payer: Self-pay | Admitting: Physician Assistant

## 2015-07-22 DIAGNOSIS — J029 Acute pharyngitis, unspecified: Secondary | ICD-10-CM

## 2015-07-22 NOTE — Progress Notes (Signed)
Based on what you shared with me it looks like you have a condition that should be evaluated in a face to face office visit.  This could be either a viral or bacterial (strep) pharyngitis. Usually with strep you have a fever > 100, sore throat, tonsilar adenopathy and aches. You need assessment in office with a strep test to make this diagnosis. Stay well hydrated and get plenty of rest. Alternate tylenol and ibuprofen if needed for sore throat or pain. Salt-water gargles and chloraseptic spray may be beneficial. Again, you will need to be seen for a throat examination and strep test before we can treat with antibiotics for strep throat.  If you are having a true medical emergency please call 911.  If you need an urgent face to face visit, [place] has four urgent care centers for your convenience.  [facility]  [phone] Get Driving Directions Find a Provider at this Location  [address] [place], [place] [address] . 8 am to 8 pm Monday-Friday . 9 am to 7 pm Saturday-Sunday  . [facility]  [phone] Get Driving Directions Find a Provider at this Location  [address] [place], [place] [address] . 8 am to 8 pm Monday-Friday . 9 am to 6 pm Saturday . 11 am to 6 pm Sunday   . [facility] at [facility]  [phone] Get Driving Directions  [address] [place], [place] [address] . 8 am to 8 pm Monday-Friday . 9 am to 4 pm Saturday-Sunday   . [facility] (a walk in primary care provider)  [phone]  Get Driving Directions Find a Provider at this Location  [address] [place], [place] [address] . 8 am to 8:30 pm Monday-Thursday . 8 am to 6 pm Friday . 8 am to 4 pm Saturday-Sunday   Your e-visit answers were reviewed by a board certified advanced clinical practitioner to complete your personal care plan.  Depending on the condition, your plan  could have included both over the counter or prescription medications.  You will get an e-mail in the next two days asking about your experience.  I hope that your e-visit has been valuable and will speed your recovery . Thank you for choosing an e-visit.

## 2015-12-27 DIAGNOSIS — E039 Hypothyroidism, unspecified: Secondary | ICD-10-CM | POA: Insufficient documentation

## 2016-01-02 MED FILL — LEVOTHYROXINE 50 MCG TABLET: 50 | 30 days supply | Qty: 30 | Fill #2

## 2016-01-04 MED FILL — FLUCONAZOLE 150 MG TABLET: 150 | 7 days supply | Qty: 3 | Fill #0

## 2016-01-28 ENCOUNTER — Telehealth: Payer: Self-pay | Admitting: Family

## 2016-01-28 DIAGNOSIS — B9689 Other specified bacterial agents as the cause of diseases classified elsewhere: Secondary | ICD-10-CM

## 2016-01-28 DIAGNOSIS — N760 Acute vaginitis: Secondary | ICD-10-CM

## 2016-01-28 DIAGNOSIS — B373 Candidiasis of vulva and vagina: Secondary | ICD-10-CM

## 2016-01-28 DIAGNOSIS — A499 Bacterial infection, unspecified: Secondary | ICD-10-CM

## 2016-01-28 DIAGNOSIS — B3731 Acute candidiasis of vulva and vagina: Secondary | ICD-10-CM

## 2016-01-28 MED ORDER — FLUCONAZOLE 150 MG PO TABS: 150.0000 mg | ORAL_TABLET | Freq: Once | ORAL | Status: DC

## 2016-01-28 MED ORDER — METRONIDAZOLE 500 MG PO TABS: 500.0000 mg | ORAL_TABLET | Freq: Two times a day (BID) | ORAL | Status: DC

## 2016-01-28 NOTE — Progress Notes (Signed)

## 2016-02-15 MED FILL — LEVOTHYROXINE 50 MCG TABLET: 50 | 30 days supply | Qty: 30 | Fill #3

## 2016-02-22 MED FILL — NORETHIN-ESTRAD-FERR 1-0.02: 1-20 | 84 days supply | Qty: 84 | Fill #3

## 2016-03-08 DIAGNOSIS — R102 Pelvic and perineal pain: Secondary | ICD-10-CM | POA: Diagnosis not present

## 2016-03-08 DIAGNOSIS — Z3200 Encounter for pregnancy test, result unknown: Secondary | ICD-10-CM | POA: Diagnosis not present

## 2016-03-08 DIAGNOSIS — R109 Unspecified abdominal pain: Secondary | ICD-10-CM | POA: Diagnosis not present

## 2016-03-08 DIAGNOSIS — N760 Acute vaginitis: Secondary | ICD-10-CM | POA: Diagnosis not present

## 2016-03-08 MED FILL — TINIDAZOLE 500 MG TABLET: 500 | 2 days supply | Qty: 8 | Fill #0

## 2016-04-05 DIAGNOSIS — R109 Unspecified abdominal pain: Secondary | ICD-10-CM | POA: Diagnosis not present

## 2016-04-05 DIAGNOSIS — R1013 Epigastric pain: Secondary | ICD-10-CM | POA: Diagnosis not present

## 2016-04-05 DIAGNOSIS — Z9049 Acquired absence of other specified parts of digestive tract: Secondary | ICD-10-CM | POA: Diagnosis not present

## 2016-04-08 MED FILL — OMEPRAZOLE DR 20 MG CAPSULE: 20 | 30 days supply | Qty: 30 | Fill #0

## 2016-04-08 MED FILL — LEVOTHYROXINE 50 MCG TABLET: 50 | 30 days supply | Qty: 30 | Fill #4

## 2016-05-16 MED FILL — NORETHIN-ESTRAD-FERR 1-0.02: 1-20 | 28 days supply | Qty: 28 | Fill #4

## 2016-05-23 DIAGNOSIS — Z114 Encounter for screening for human immunodeficiency virus [HIV]: Secondary | ICD-10-CM | POA: Diagnosis not present

## 2016-05-23 DIAGNOSIS — B373 Candidiasis of vulva and vagina: Secondary | ICD-10-CM | POA: Diagnosis not present

## 2016-05-23 DIAGNOSIS — Z01419 Encounter for gynecological examination (general) (routine) without abnormal findings: Secondary | ICD-10-CM | POA: Diagnosis not present

## 2016-05-23 DIAGNOSIS — Z113 Encounter for screening for infections with a predominantly sexual mode of transmission: Secondary | ICD-10-CM | POA: Diagnosis not present

## 2016-05-23 DIAGNOSIS — Z118 Encounter for screening for other infectious and parasitic diseases: Secondary | ICD-10-CM | POA: Diagnosis not present

## 2016-05-23 DIAGNOSIS — Z1159 Encounter for screening for other viral diseases: Secondary | ICD-10-CM | POA: Diagnosis not present

## 2016-05-23 DIAGNOSIS — Z6832 Body mass index (BMI) 32.0-32.9, adult: Secondary | ICD-10-CM | POA: Diagnosis not present

## 2016-05-23 MED FILL — LEVOTHYROXINE 50 MCG TABLET: 50 | 30 days supply | Qty: 30 | Fill #5

## 2016-05-23 MED FILL — TERCONAZOLE 0.4% VAG CREAM: 0.4 | 7 days supply | Qty: 45 | Fill #0

## 2016-05-29 MED FILL — AZITHROMYCIN 500 MG TABLET: 500 | 1 days supply | Qty: 2 | Fill #0

## 2016-06-13 MED FILL — NORETHIN-ESTRAD-FERR 1-0.02: 1-20 | 84 days supply | Qty: 84 | Fill #0

## 2016-06-18 DIAGNOSIS — N760 Acute vaginitis: Secondary | ICD-10-CM | POA: Diagnosis not present

## 2016-06-18 DIAGNOSIS — R350 Frequency of micturition: Secondary | ICD-10-CM | POA: Diagnosis not present

## 2016-06-18 MED FILL — TINIDAZOLE 500 MG TABLET: 500 | 2 days supply | Qty: 8 | Fill #0

## 2016-07-09 DIAGNOSIS — Z113 Encounter for screening for infections with a predominantly sexual mode of transmission: Secondary | ICD-10-CM | POA: Diagnosis not present

## 2016-07-09 DIAGNOSIS — A749 Chlamydial infection, unspecified: Secondary | ICD-10-CM | POA: Diagnosis not present

## 2016-07-23 ENCOUNTER — Ambulatory Visit (INDEPENDENT_AMBULATORY_CARE_PROVIDER_SITE_OTHER): Payer: 59

## 2016-07-23 ENCOUNTER — Ambulatory Visit (INDEPENDENT_AMBULATORY_CARE_PROVIDER_SITE_OTHER): Payer: 59 | Admitting: Emergency Medicine

## 2016-07-23 ENCOUNTER — Ambulatory Visit: Payer: 59

## 2016-07-23 VITALS — BP 124/82 | HR 70 | Temp 98.1°F | Resp 18 | Ht 62.75 in | Wt 179.0 lb

## 2016-07-23 DIAGNOSIS — M79644 Pain in right finger(s): Secondary | ICD-10-CM

## 2016-07-23 DIAGNOSIS — R358 Other polyuria: Secondary | ICD-10-CM | POA: Diagnosis not present

## 2016-07-23 DIAGNOSIS — R631 Polydipsia: Secondary | ICD-10-CM | POA: Diagnosis not present

## 2016-07-23 DIAGNOSIS — E038 Other specified hypothyroidism: Secondary | ICD-10-CM

## 2016-07-23 DIAGNOSIS — M7989 Other specified soft tissue disorders: Secondary | ICD-10-CM | POA: Diagnosis not present

## 2016-07-23 DIAGNOSIS — R5383 Other fatigue: Secondary | ICD-10-CM | POA: Diagnosis not present

## 2016-07-23 DIAGNOSIS — R3589 Other polyuria: Secondary | ICD-10-CM

## 2016-07-23 LAB — POCT CBC
Granulocyte percent: 65.2 % (ref 37–80)
HCT, POC: 38.1 % (ref 37.7–47.9)
Hemoglobin: 12.7 g/dL (ref 12.2–16.2)
Lymph, poc: 1.8 (ref 0.6–3.4)
MCH, POC: 28.3 pg (ref 27–31.2)
MCHC: 33.4 g/dL (ref 31.8–35.4)
MCV: 84.6 fL (ref 80–97)
MID (cbc): 0.4 (ref 0–0.9)
MPV: 9.1 fL (ref 0–99.8)
POC Granulocyte: 4 (ref 2–6.9)
POC LYMPH PERCENT: 28.7 % (ref 10–50)
POC MID %: 6.1 % (ref 0–12)
Platelet Count, POC: 192 10*3/uL (ref 142–424)
RBC: 4.5 M/uL (ref 4.04–5.48)
RDW, POC: 13.3 %
WBC: 6.1 10*3/uL (ref 4.6–10.2)

## 2016-07-23 LAB — POC MICROSCOPIC URINALYSIS (UMFC): MUCUS RE: ABSENT

## 2016-07-23 LAB — POCT URINALYSIS DIP (MANUAL ENTRY)
GLUCOSE UA: NEGATIVE
LEUKOCYTES UA: NEGATIVE
Nitrite, UA: NEGATIVE
Protein Ur, POC: 30 — AB
SPEC GRAV UA: 1.025
Urobilinogen, UA: 0.2
pH, UA: 5.5

## 2016-07-23 LAB — BASIC METABOLIC PANEL WITH GFR
BUN: 11 mg/dL (ref 7–25)
CO2: 23 mmol/L (ref 20–31)
Calcium: 8.9 mg/dL (ref 8.6–10.2)
Chloride: 109 mmol/L (ref 98–110)
Creat: 0.69 mg/dL (ref 0.50–1.10)
GFR, Est African American: >89 mL/min (ref >=60)
GFR, Est Non African American: >89 mL/min (ref >=60)
GLUCOSE: 87 mg/dL (ref 65–99)
POTASSIUM: 4.1 mmol/L (ref 3.5–5.3)
SODIUM: 143 mmol/L (ref 135–146)

## 2016-07-23 LAB — GLUCOSE, POCT (MANUAL RESULT ENTRY): POC Glucose: 82 mg/dL (ref 70–99)

## 2016-07-23 LAB — THYROID PANEL WITH TSH
Free Thyroxine Index: 2 (ref 1.4–3.8)
T3 Uptake: 23 % (ref 22–35)
T4, Total: 8.8 ug/dL (ref 4.5–12.0)
TSH: 8.18 m[IU]/L — ABNORMAL HIGH

## 2016-07-23 LAB — POCT GLYCOSYLATED HEMOGLOBIN (HGB A1C): Hemoglobin A1C: 5.4

## 2016-07-23 NOTE — Patient Instructions (Addendum)
Please do exercises using her right hand to improve your range of motion. I will call you with results of your thyroid studies to decide on what treatment is appropriate.    IF you received an x-ray today, you will receive an invoice from [facility]. Please contact [facility] at [phone] with questions or concerns regarding your invoice.   IF you received labwork today, you will receive an invoice from [employer]. Please contact [facility] at [phone] with questions or concerns regarding your invoice.   Our billing staff will not be able to assist you with questions regarding bills from these companies.  You will be contacted with the lab results as soon as they are available. The fastest way to get your results is to activate your My Chart account. Instructions are located on the last page of this paperwork. If you have not heard from [place] regarding the results in 2 weeks, please contact this office.

## 2016-07-23 NOTE — Progress Notes (Addendum)
By signing my name below, I, Mesha Guinyard, attest that this documentation has been prepared under the direction and in the presence of Lesle Chris, MD.  Electronically Signed: Arvilla Market, Medical Scribe. 07/23/16. 10:41 AM.  Chief Complaint:  Chief Complaint  Patient presents with  . Other    Pt requesting A1C and thyroid labs. States she was previously on thryroid medication.    HPI: Brittany Morrow is a 30 y.o. female who reports to [facility] today for blood work for thyroid and A1c levels. Pt was on Levothyroxine to manage her thyroid- pt has been taking them for 2 years. Pt hasn't been taking Levothyroxine for 2 months and she's begining to feel fatigue, and states her face is "puffy" in the morning when she wakes up. Pt reports her endocrinologist has retired and it's been difficult to get an appointment with that office. Pt takes Vit. D supplements and Junel for birth control. Pt denies possibility of current pregnancy.   A1c: Pt states she's been thirsty often, and having urinary frequency. Pt states she could drink water all day and still feel thirsty. Pt states she's getting up 3-4 times at night to urinate and she's peeing often in the day. Pt has a FHx of DM. Pt denies having any complications during her pregnancy. Pt denies dysuria, or odor in her urine.  Thumb Pain: Pt mentions her right thumbhas been swollen for a month. Pt states she woke up one morning and it was blue and purple. Pt reports the pain occasionally radiates to her index finger.   Past Medical History:  Diagnosis Date  . Dysplasia of cervix, low grade (CIN 1) 2006   LASER OF VAG AND CERVIX FOR CIN1 AND VAIN1  . Gonorrhea 2014   Positive culture  . STD (sexually transmitted disease) 09/2001, 12/2003   POSITIVE CHLAMYDIA  . Thyroid disease   . VAIN I (vaginal intraepithelial neoplasia grade I) 2006   LASER OF CERVIX AND VAG/ VAIN 1 AND CIN1  . Vitamin D deficiency    Past Surgical History:  Procedure  Laterality Date  . BREAST SURGERY  2008   REDUCTION MAMMOPLASTY  . CHOLECYSTECTOMY    . gonorrhea history     2014  . LASER VAPORIZATION OF CERVIX AND VAGINA  2008   [facility]--DR FERNANDEZ   Social History   Social History  . Marital status: Single    Spouse name: N/A  . Number of children: N/A  . Years of education: N/A   Social History Main Topics  . Smoking status: Never Smoker  . Smokeless tobacco: Never Used  . Alcohol use Yes     Comment: RARE-SOCIALLY ONLY  . Drug use: No  . Sexual activity: Yes    Birth control/ protection: IUD     Comment: MIRENA   Other Topics Concern  . None   Social History Narrative  . None   Family History  Problem Relation Age of Onset  . Diabetes Father   . Diabetes Paternal Grandmother   . Hypertension Mother    No Known Allergies Prior to Admission medications   Medication Sig Start Date End Date Taking? Authorizing Provider  cholecalciferol (VITAMIN D) 1000 UNITS tablet Take 1,000 Units by mouth daily.   Yes Historical Provider, MD  norethindrone-ethinyl estradiol (JUNEL FE,GILDESS FE,LOESTRIN FE) 1-20 MG-MCG tablet Take 1 tablet by mouth daily.   Yes Historical Provider, MD     ROS: The patient denies fevers, chills, night sweats, unintentional weight loss, chest  pain, palpitations, wheezing, dyspnea on exertion, nausea, vomiting, abdominal pain, dysuria, hematuria, melena, numbness, weakness, or tingling.   All other systems have been reviewed and were otherwise negative with the exception of those mentioned in the HPI and as above.    PHYSICAL EXAM: Vitals:   07/23/16 1022  BP: 124/82  Pulse: 70  Resp: 18  Temp: 98.1 F (36.7 C)   Body mass index is 31.96 kg/m.   General: Alert, no acute distress HEENT:  Normocephalic, atraumatic, oropharynx patent. Eye: Nonie Hoyer [facility] Cardiovascular:  Regular rate and rhythm, no rubs murmurs or gallops.  No Carotid bruits, radial pulse intact. No pedal edema.  Respiratory: Clear to  auscultation bilaterally.  No wheezes, rales, or rhonchi.  No cyanosis, no use of accessory musculature Abdominal: No organomegaly, abdomen is soft and non-tender, positive bowel sounds.  No masses. Musculoskeletal: Gait intact. No edema, tenderness. Right hand: Inability to fully extend of the IP joint of right thumb. FROM of the right index finger, no swelling Skin: No rashes. Neurologic: Facial musculature symmetric. Psychiatric: Patient acts appropriately throughout our interaction. Lymphatic: No cervical or submandibular lymphadenopathy  LABS: Results for orders placed or performed in visit on 07/23/16  POCT CBC  Result Value Ref Range   WBC 6.1 4.6 - 10.2 K/uL   Lymph, poc 1.8 0.6 - 3.4   POC LYMPH PERCENT 28.7 10 - 50 %L   MID (cbc) 0.4 0 - 0.9   POC MID % 6.1 0 - 12 %M   POC Granulocyte 4.0 2 - 6.9   Granulocyte percent 65.2 37 - 80 %G   RBC 4.50 4.04 - 5.48 M/uL   Hemoglobin 12.7 12.2 - 16.2 g/dL   HCT, POC 16.1 09.6 - 47.9 %   MCV 84.6 80 - 97 fL   MCH, POC 28.3 27 - 31.2 pg   MCHC 33.4 31.8 - 35.4 g/dL   RDW, POC 04.5 %   Platelet Count, POC 192 142 - 424 K/uL   MPV 9.1 0 - 99.8 fL  POCT glucose (manual entry)  Result Value Ref Range   POC Glucose 82 70 - 99 mg/dl  POCT glycosylated hemoglobin (Hb A1C)  Result Value Ref Range   Hemoglobin A1C 5.4   POCT urinalysis dipstick  Result Value Ref Range   Color, UA yellow yellow   Clarity, UA cloudy (A) clear   Glucose, UA negative negative   Bilirubin, UA small (A) negative   Ketones, POC UA trace (5) (A) negative   Spec Grav, UA 1.025    Blood, UA large (A) negative   pH, UA 5.5    Protein Ur, POC =30 (A) negative   Urobilinogen, UA 0.2    Nitrite, UA Negative Negative   Leukocytes, UA Negative Negative  POCT Microscopic Urinalysis ([facility])  Result Value Ref Range   WBC,UR,HPF,POC Few (A) None WBC/hpf   RBC,UR,HPF,POC Moderate (A) None RBC/hpf   Bacteria Many (A) None, Too numerous to count   Mucus Absent  Absent   Epithelial Cells, UR Per Microscopy Many (A) None, Too numerous to count cells/hpf   EKG/XRAY:   Primary read interpreted by Dr. Cleta Alberts at [facility]. Dg Finger Index Right  Result Date: 07/23/2016 CLINICAL DATA:  Pain following injury EXAM: RIGHT SECOND FINGER 2+V COMPARISON:  None. FINDINGS: Frontal, oblique, and lateral views were obtained. There is no fracture or dislocation. The joint spaces appear normal. No erosive change. IMPRESSION: No fracture or dislocation.  No apparent arthropathy. Electronically Signed   By:  Bretta Bang III M.D.   On: 07/23/2016 11:22  Dg Finger Thumb Right  Result Date: 07/23/2016 CLINICAL DATA:  Pain following injury EXAM: RIGHT THUMB 2+V COMPARISON:  None. FINDINGS: Frontal, oblique, and lateral views obtained. There is mild soft tissue swelling. There is no demonstrable fracture or dislocation. There is no appreciable joint space narrowing. A small focus of calcification in the volar aspect of the first IP joint appears arthropathic in etiology. No erosive change. IMPRESSION: Mild soft tissue swelling. No fracture or dislocation. No appreciable joint space narrowing. Small focus of calcification in the volar aspect of the first IP joint is likely arthropathic in etiology. Electronically Signed   By: Bretta Bang III M.D.   On: 07/23/2016 11:20  ASSESSMENT/PLAN: No abnormality seen regarding her polyuria. Suggested she decrease her by mouth intake and not drink caffeinated beverages. No evidence of fracture on her hand exam. Awaiting results of her basic metabolic panel as well as thyroid studies to decide on treatment.I personally performed the services described in this documentation, which was scribed in my presence. The recorded information has been reviewed and is accurate.   Gross sideeffects, risk and benefits, and alternatives of medications d/w patient. Patient is aware that all medications have potential sideeffects and we are unable to predict  every sideeffect or drug-drug interaction that may occur.  Lesle Chris MD 07/23/2016 12:03 PM

## 2016-07-24 ENCOUNTER — Other Ambulatory Visit: Payer: Self-pay | Admitting: Emergency Medicine

## 2016-07-24 MED ORDER — LEVOTHYROXINE SODIUM 50 MCG PO TABS: 50.0000 ug | ORAL_TABLET | Freq: Every day | ORAL | 3 refills | Status: DC

## 2016-07-24 MED FILL — LEVOTHYROXINE 50 MCG TABLET: 50 | 90 days supply | Qty: 90 | Fill #0

## 2016-07-24 NOTE — Addendum Note (Signed)
Addended by: Lesle Chris A on: 07/24/2016 07:53 AM   Modules accepted: Orders

## 2016-07-25 ENCOUNTER — Telehealth: Payer: Self-pay | Admitting: Emergency Medicine

## 2016-07-25 NOTE — Telephone Encounter (Signed)
-----   Message from Collene Gobble, MD sent at 07/24/2016  7:56 AM EDT ----- Call patient. I have sent in a prescription for her Synthroid to take for her low thyroid. There is some evidence of inflammation of the interphalangeal joint of the thumb but no fracture seen. I would recommend she work on the exercise program of bending her thumb if she continues to have trouble let me know and I will make a referral over to the hand specialist.

## 2016-09-04 ENCOUNTER — Telehealth: Payer: Self-pay | Admitting: Emergency Medicine

## 2016-09-04 NOTE — Telephone Encounter (Signed)
-----   Message from Collene GobbleSteven A Daub, MD sent at 07/25/2016  3:11 PM EDT ----- Yes patient needs follow-up thyroid studies in 6 months.

## 2016-09-05 MED FILL — NORETHIN-ESTRAD-FERR 1-0.02: 1-20 | 84 days supply | Qty: 84 | Fill #1

## 2016-10-28 ENCOUNTER — Ambulatory Visit (INDEPENDENT_AMBULATORY_CARE_PROVIDER_SITE_OTHER): Payer: 59 | Admitting: Physician Assistant

## 2016-10-28 VITALS — BP 116/80 | HR 83 | Temp 98.2°F | Resp 17 | Ht 62.75 in | Wt 185.0 lb

## 2016-10-28 DIAGNOSIS — E039 Hypothyroidism, unspecified: Secondary | ICD-10-CM | POA: Diagnosis not present

## 2016-10-28 LAB — THYROID PANEL WITH TSH
FREE THYROXINE INDEX: 2 (ref 1.4–3.8)
T3 UPTAKE: 25 % (ref 22–35)
T4 TOTAL: 8 ug/dL (ref 4.5–12.0)
TSH: 5.89 mIU/L — ABNORMAL HIGH

## 2016-10-28 NOTE — Patient Instructions (Addendum)
  I will refill your medication once I have your lab results. Please call our office if you do not hear back from me in three days. Follow up in 6 months for lab work.  Thank you!    IF you received an x-ray today, you will receive an invoice from [facility]. Please contact [facility] at [phone] with questions or concerns regarding your invoice.   IF you received labwork today, you will receive an invoice from [facility]. Please contact [facility] at [phone] with questions or concerns regarding your invoice.   Our billing staff will not be able to assist you with questions regarding bills from these companies.  You will be contacted with the lab results as soon as they are available. The fastest way to get your results is to activate your My Chart account. Instructions are located on the last page of this paperwork. If you have not heard from us regarding the results in 2 weeks, please contact this office.

## 2016-10-28 NOTE — Progress Notes (Signed)
   Brittany Morrow  MRN: [id number] DOB: 29-Nov-1986  Subjective:  Brittany Morrow is a 30 y.o. female seen in office today for a chief complaint of medication refill for levothyroxine 50mcg tablet for hypothyroidism. Pt states she has had a diagnosis of hypothyroidism for 2 years. However, before her last visit 6 months ago, she had stopped taking her synthroid because she thought her hypothyroidism was healed but started having extreme fatigue so she came for a visit in 06/2015 and was placed back on synthroid 50mcg. She was told to follow up in 6 months for labs. No other concerns or complaints today.   Review of Systems  Constitutional: Negative for chills, fatigue and unexpected weight change.  Cardiovascular: Negative for chest pain and palpitations.  Neurological: Negative for dizziness and headaches.    Patient Active Problem List   Diagnosis Date Noted  . GC (gonococcus infection) 09/16/2013  . Family history of diabetes mellitus 08/06/2013  . Weight gain 08/06/2013  . History of vitamin D deficiency 08/06/2013  . Dysplasia of cervix, low grade (CIN 1) 07/31/2012    Current Outpatient Prescriptions on File Prior to Visit  Medication Sig Dispense Refill  . cholecalciferol (VITAMIN D) 1000 UNITS tablet Take 1,000 Units by mouth daily.    Marland Kitchen. levothyroxine (SYNTHROID, LEVOTHROID) 50 MCG tablet Take 1 tablet (50 mcg total) by mouth daily. 90 tablet 3  . norethindrone-ethinyl estradiol (JUNEL FE,GILDESS FE,LOESTRIN FE) 1-20 MG-MCG tablet Take 1 tablet by mouth daily.     No current facility-administered medications on file prior to visit.     No Known Allergies  Objective:  BP 116/80 (BP Location: Right Arm, Patient Position: Sitting, Cuff Size: Large)   Pulse 83   Temp 98.2 F (36.8 C) (Oral)   Resp 17   Ht 5' 2.75" (1.594 m)   Wt 185 lb (83.9 kg)   LMP 10/21/2016 (Approximate)   SpO2 96%   BMI 33.03 kg/m   Physical Exam  Constitutional: She is oriented to person,  place, and time and well-developed, well-nourished, and in no distress.  HENT:  Head: Normocephalic and atraumatic.  Eyes: Conjunctivae are normal.  Neck: Normal range of motion.  Cardiovascular: Normal rate, regular rhythm, normal heart sounds and intact distal pulses.   Pulmonary/Chest: Effort normal and breath sounds normal.  Neurological: She is alert and oriented to person, place, and time. Gait normal.  Skin: Skin is warm and dry.  Psychiatric: Affect normal.  Vitals reviewed.   Assessment and Plan :  1. Hypothyroidism, unspecified type -Await lab results - Thyroid Panel With TSH -Will give refill for six month prescription of levothyroxine once I have lab results to ensure patient has proper dose.   Benjiman CoreBrittany Saiya Crist PA-C  [facility] 10/28/2016 1:55 PM

## 2016-10-30 ENCOUNTER — Other Ambulatory Visit: Payer: Self-pay | Admitting: Physician Assistant

## 2016-10-31 ENCOUNTER — Other Ambulatory Visit: Payer: Self-pay

## 2016-10-31 MED ORDER — LEVOTHYROXINE SODIUM 50 MCG PO TABS: 50.0000 ug | ORAL_TABLET | Freq: Every day | ORAL | 1 refills | Status: DC

## 2016-10-31 MED FILL — LEVOTHYROXINE 50 MCG TABLET: 50 | 90 days supply | Qty: 90 | Fill #0

## 2016-11-26 MED FILL — NORETHIN-ESTRAD-FERR 1-0.02: 1-20 | 84 days supply | Qty: 84 | Fill #2

## 2016-12-19 DIAGNOSIS — M25562 Pain in left knee: Secondary | ICD-10-CM | POA: Diagnosis not present

## 2016-12-19 DIAGNOSIS — M5442 Lumbago with sciatica, left side: Secondary | ICD-10-CM | POA: Diagnosis not present

## 2016-12-25 DIAGNOSIS — M25562 Pain in left knee: Secondary | ICD-10-CM | POA: Diagnosis not present

## 2016-12-30 HISTORY — PX: ABDOMINOPLASTY: SUR9

## 2017-02-10 MED FILL — NORETHIN-ESTRAD-FERR 1-0.02: 1-20 | 84 days supply | Qty: 84 | Fill #3

## 2017-02-18 ENCOUNTER — Ambulatory Visit (INDEPENDENT_AMBULATORY_CARE_PROVIDER_SITE_OTHER): Payer: 59 | Admitting: Family Medicine

## 2017-02-18 ENCOUNTER — Encounter: Payer: Self-pay | Admitting: Family Medicine

## 2017-02-18 VITALS — BP 128/82 | HR 78 | Temp 98.5°F | Ht 63.0 in | Wt 181.2 lb

## 2017-02-18 DIAGNOSIS — R350 Frequency of micturition: Secondary | ICD-10-CM

## 2017-02-18 DIAGNOSIS — M7632 Iliotibial band syndrome, left leg: Secondary | ICD-10-CM | POA: Insufficient documentation

## 2017-02-18 DIAGNOSIS — E039 Hypothyroidism, unspecified: Secondary | ICD-10-CM | POA: Diagnosis not present

## 2017-02-18 LAB — POCT URINALYSIS DIPSTICK
Bilirubin, UA: NEGATIVE
Blood, UA: NEGATIVE
Glucose, UA: NEGATIVE
Ketones, UA: NEGATIVE
Leukocytes, UA: NEGATIVE
Nitrite, UA: NEGATIVE
Protein, UA: NEGATIVE
Spec Grav, UA: 1.01
Urobilinogen, UA: 0.2
pH, UA: 6

## 2017-02-18 MED ORDER — LEVOTHYROXINE SODIUM 50 MCG PO TABS: 50.0000 ug | ORAL_TABLET | Freq: Every day | ORAL | 3 refills | Status: DC

## 2017-02-18 MED FILL — LEVOTHYROXINE 50 MCG TABLET: 50 | 90 days supply | Qty: 90 | Fill #0

## 2017-02-18 NOTE — Patient Instructions (Addendum)
Results for orders placed or performed in visit on 10/28/16  Thyroid Panel With TSH  Result Value Ref Range   T4, Total 8.0 4.5 - 12.0 ug/dL   T3 Uptake 25 22 - 35 %   Free Thyroxine Index 2.0 1.4 - 3.8   TSH 5.89 (H) mIU/L   Results for orders placed or performed in visit on 02/18/17  POC Urinalysis Dipstick  Result Value Ref Range   Color, UA Light Yellow    Clarity, UA Clear    Glucose, UA Negative    Bilirubin, UA Negative    Ketones, UA Negative    Spec Grav, UA 1.010    Blood, UA Negative    pH, UA 6.0    Protein, UA Negative    Urobilinogen, UA 0.2    Nitrite, UA Negative    Leukocytes, UA Negative Negative

## 2017-02-18 NOTE — Progress Notes (Signed)
Pre visit review using our clinic review tool, if applicable. No additional management support is needed unless otherwise documented below in the visit note. 

## 2017-02-18 NOTE — Progress Notes (Signed)
Brittany Morrow is a 31 y.o. [facility]   History of Present Illness:    1. Acquired hypothyroidism. Same dose for 4 years. Last checked 3 months ago. Endocrine ROS: negative for - hair pattern changes, skin changes, temperature intolerance or unexpected weight changes. Last thyroid panel reviewed.   2. Urinary frequency. Genito-Urinary ROS: positive for - urinary frequency/urgency negative for - dysuria, hematuria, pelvic pain or vulvar/vaginal symptoms. 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 Nocturia x 1-2.   3. Iliotibial band syndrome of left side.  Lateral knee, radiates up lateral leg to hip. Worse at the end of the day. Previous xray of knee was negative. Has been told that she had hip bursitis - injections helped a little. Was told once that she may have sciatica and MRI recommended, but she has been hesitant to spend the money. No trauma.     There are no preventive care reminders to display for this patient.   PMHx, SurgHx, SocialHx, Medications, and Allergies were reviewed in the Visit Navigator and updated as appropriate.    Past Medical History:  Diagnosis Date  . Dysplasia of cervix, low grade (CIN 1) 2006  . Hypothyroidism   . Vitamin D deficiency     Past Surgical History:  Procedure Laterality Date  . BREAST SURGERY  2008   REDUCTION MAMMOPLASTY  . CHOLECYSTECTOMY    . LASER VAPORIZATION OF CERVIX AND VAGINA  2008   [facility]--DR FERNANDEZ    Family History  Problem Relation Age of Onset  . Diabetes Father   . Alcohol abuse Father   . Diabetes Paternal Grandmother   . Hypertension Mother     Social History  Substance Use Topics  . Smoking status: Never Smoker  . Smokeless tobacco: Never Used  . Alcohol use Yes     Comment: RARE-SOCIALLY ONLY    Current Medications and Allergies:    Current Outpatient Prescriptions:  .  cholecalciferol (VITAMIN D) 1000 UNITS tablet, Take  1,000 Units by mouth daily., Disp: , Rfl:  .  levothyroxine (SYNTHROID, LEVOTHROID) 50 MCG tablet, Take 1 tablet (50 mcg total) by mouth daily., Disp: 90 tablet, Rfl: 3 .  norethindrone-ethinyl estradiol (JUNEL FE,GILDESS FE,LOESTRIN FE) 1-20 MG-MCG tablet, Take 1 tablet by mouth daily., Disp: , Rfl:    Allergies  Allergen Reactions  . Tramadol Itching    Patient Information Form: Screening and ROS    Do you feel safe in relationships? yes PHQ-2: negative  Review of Systems  General:  Negative for unexplained weight loss, fever Skin: Negative for new or changing mole, sore that won't heal HEENT: Negative for trouble hearing, trouble seeing, ringing in ears, mouth sores, hoarseness, change in voice, dysphagia CV:  Negative for chest pain, dyspnea, edema, palpitations Resp: Negative for cough, dyspnea, hemoptysis GI: Negative for nausea, vomiting, diarrhea, constipation, abdominal pain, melena, hematochezia GU: Negative for dysuria, incontinence, urinary hesitance, hematuria, vaginal discharge MSK: Negative for muscle cramps or aches, joint pain or swelling Neuro: Negative for headaches, weakness, numbness, dizziness, passing out/fainting Psych: Negative for depression, anxiety, memory problems   Vitals:   Vitals:   02/18/17 0929  BP: 128/82  Pulse: 78  Temp: 98.5 F (36.9 C)  TempSrc: Oral  SpO2: 97%  Weight: 181 lb 3.2 oz (82.2 kg)  Height: 5\' 3"  (1.6 m)     Body mass index is 32.1 kg/m.   Physical Exam:  General: Alert, cooperative, appears stated age and no distress.  HEENT:  Normocephalic, without obvious abnormality, atraumatic. Conjunctivae/corneas clear. PERRL, EOM's intact. Normal TM's and external ear canals both ears. Nares normal. Septum midline. Mucosa normal. No drainage or sinus tenderness. Lips, mucosa, and tongue normal; teeth and gums normal.  Lungs: Clear to auscultation bilaterally.  Heart:: Regular rate and rhythm, S1, S2 normal, no murmur,  click, rub or gallop.  Abdomen: Soft, non-tender; bowel sounds normal; no masses,  no organomegaly.  Extremities: Extremities normal, atraumatic, no cyanosis or edema.  Pulses: 2+ and symmetric.  Skin: Skin color, texture, turgor normal. No rashes or lesions.  Neurologic: Alert and oriented X 3, normal strength and tone. Normal symmetric. reflexes. Normal coordination and gait.  Psych: Alert,oriented, in NAD with a full range of affect, normal behavior and no psychotic features   Results for orders placed or performed in visit on 02/18/17  POC Urinalysis Dipstick  Result Value Ref Range   Color, UA Light Yellow    Clarity, UA Clear    Glucose, UA Negative    Bilirubin, UA Negative    Ketones, UA Negative    Spec Grav, UA 1.010    Blood, UA Negative    pH, UA 6.0    Protein, UA Negative    Urobilinogen, UA 0.2    Nitrite, UA Negative    Leukocytes, UA Negative Negative      Assessment and Plan:    Shandrika was seen today for establish care and medication refill.  Diagnoses and all orders for this visit:  Urinary frequency Comments: Negative. Continue hydration until 5 pm. Orders: -     POC Urinalysis Dipstick  Acquired hypothyroidism Comments: Stable. Refill until 1 year mark. Orders: -     levothyroxine (SYNTHROID, LEVOTHROID) 50 MCG tablet; Take 1 tablet (50 mcg total) by mouth daily.  Iliotibial band syndrome of left side Comments: Handout provided. Orders: -     AMB referral to sports medicine   . Reviewed expectations re: course of current medical issues. . Discussed self-management of symptoms. . Outlined signs and symptoms indicating need for more acute intervention. . Patient verbalized understanding and all questions were answered. . See orders for this visit as documented in the electronic medical record. . Patient received an After Visit Summary.  Records requested if needed. I spent 45 minutes with this patient, greater than 50% was face-to-face time  counseling regarding the above diagnoses.    Helane Rima, D.O. [place] [place] 02/18/2017   Follow-up: No Follow-up on file.  Meds ordered this encounter  Medications  . levothyroxine (SYNTHROID, LEVOTHROID) 50 MCG tablet    Sig: Take 1 tablet (50 mcg total) by mouth daily.    Dispense:  90 tablet    Refill:  3   Medications Discontinued During This Encounter  Medication Reason  . levothyroxine (SYNTHROID, LEVOTHROID) 50 MCG tablet Reorder   Orders Placed This Encounter  Procedures  . AMB referral to sports medicine  . POC Urinalysis Dipstick

## 2017-02-24 ENCOUNTER — Encounter: Payer: Self-pay | Admitting: Sports Medicine

## 2017-02-24 ENCOUNTER — Ambulatory Visit (INDEPENDENT_AMBULATORY_CARE_PROVIDER_SITE_OTHER): Payer: 59 | Admitting: Sports Medicine

## 2017-02-24 ENCOUNTER — Ambulatory Visit: Payer: Self-pay

## 2017-02-24 VITALS — BP 110/72 | HR 72 | Ht 63.0 in | Wt 183.2 lb

## 2017-02-24 DIAGNOSIS — M7632 Iliotibial band syndrome, left leg: Secondary | ICD-10-CM | POA: Diagnosis not present

## 2017-02-24 NOTE — Patient Instructions (Signed)
Please perform the exercise program that Fayrene FearingJames has prepared for you and gone over in detail on a daily basis.  In addition to the handout you were provided you can access your program through: [email or web address]   Your unique program code is: [email or web address]

## 2017-02-24 NOTE — Progress Notes (Signed)
Brittany Morrow - 31 y.o. female MRN [id number]  Date of birth: 1986-01-18  Office Visit Note: Visit Date: 02/24/2017 PCP: Helane Rima, DO Referred by: Helane Rima, DO  Subjective: Chief Complaint  Patient presents with  . pain in left knee    Iliotibial band syndrome of left side.  Lateral knee, radiates up lateral leg to hip. Worse at the end of the day. Previous xray of knee was negative. Has been told that she had hip bursitis - injections helped a little. Was told once that she may have sciatica and MRI recommended. No trauma. Pain started about 4-5 years ago. Sometimes pain comes from left hip and goes down the leg. The pain is constant.     HPI: Patient presents with the above symptoms.  She has had worsening pain over the past several months but has had issues with this for many years.  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  Does not inform any specific exercises.  Pain is mainly along the lateral thigh is currently located along the lateral knee.  Denies any catching, locking or giving way.  NSAIDs are minimally helpful. ROS: Otherwise per HPI.  Objective:  VS:  HT:5\' 3"  (160 cm)   WT:183 lb 3.2 oz (83.1 kg)  BMI:32.5    BP:110/72  HR:72bpm  TEMP: ( )  RESP:98 % Physical Exam: GENERAL:  WDWN, NAD, Non-toxic appearing PSYCH:  Alert & appropriately interactive  Not depressed or anxious appearing LOWER EXTREMITIES:  No significant rashes/lesions/ulcerations overlying the legs.  No significant pretibial edema.  No clubbing or cyanosis.  DP & PT pulses 2+/4.  Sensation intact to light touch. BACK/HIP:   Bilateral negative straight leg raise.  No significant midline tenderness.    Good internal and external rotation of the hips.  Patient is able to heel and toe walk without significant difficulty  Manual muscle testing is 5+/5 in BLE myotomes without focality  Lower extremity DTRs 2+/4 diffusely and symmetric LEFT  KNEE:  Slight genu valgus but overall well aligned.  Minimal lateral riding/tracking of the patella.    No effusion..    ROM: 0 to 120.    Extensor mechanism intact.  Mild hip abduction weakness bilaterally left worse than right.  Tensor Fascia Lata predominance with anterior pelvic tilt  No significant medial or lateral joint line tenderness.  Pain with palpation of the distal ITB over the lateral femoral condyle as well as insertion of Gertie's tubercle.  Positive Noble sign  Stable to varus/valgus strain & anterior/posterior drawer.  Normal Lachman's.    Negative McMurray's and Thessaly.     Imaging & Procedures: No results found. PROCEDURE NOTE: ULTRASOUND GUIDED LEFT ITB DISTAL INSERTIONINJECTION  Images were obtained and interpreted by myself, Gaspar Bidding, DO  Images have been saved and stored to PACS system. Images obtained on: GE S7 Ultrasound machine  DESCRIPTION OF PROCEDURE:  The patient's clinical condition is marked by substantial pain and/or significant functional disability. Other conservative therapy has not provided relief, is contraindicated, or not appropriate. There is a reasonable likelihood that injection will significantly improve the patient's pain and/or functional impairment. After discussing the risks, benefits and expected outcomes of the injection and all questions were reviewed and answered, the patient wished to undergo the above named procedure. Verbal consent was obtained. The ultrasound was used to identify the target structure and adjacent neurovascular structures. The skin was then prepped in sterile fashion and the target structure was  injected under direct visualization using sterile technique as below: PREP: Alcohol, Ethel Chloride APPROACH: lateral, 25g 1.5" needle INJECTATE: 3cc 1% lidocaine, 1cc 40mg  DepoMedrol ASPIRATE: N/A DRESSING: Band-Aid   Post procedural instructions including recommending icing and warning signs for  infection were reviewed. This procedure was well tolerated and there were no complications.   IMPRESSION: Succesful US Guided Injection  +++++++++++++++++++++++++++++++++++++++++++++++++++++++++++++++++++++++++++++   PROCEDURE NOTE: THERAPEUTIC EXERCISES (97110) 15 minutes spent for Therapeutic exercises as stated in above notes.  This included exercises focusing on stretching, strengthening, with significant focus on eccentric aspects.   Proper technique shown and discussed handout in great detail with ATC.  All questions were discussed and answered.      Assessment & Plan: Problem List Items Addressed This Visit    Iliotibial band syndrome of left side - Primary    Patient presents with fairly classic Benadryl with marked weakness of the hip abductor's.  Since of time was spent discussing the pathophysiology and therapeutic exercises were reviewed in detail with athletic training staff.  Injection performed today for acute flareup.  Emphasized the importance of daily therapeutic exercises to help correct this.  Consider evaluation of gait and/or consideration of custom orthotics if any lack of improvement.       Relevant Orders   US Guided Needle Placement      Follow-up: Return in about 4 weeks (around 03/24/2017).   Past Medical/Family/Surgical/Social History: Medications & Allergies reviewed per EMR Patient Active Problem List   Diagnosis Date Noted  . Iliotibial band syndrome of left side 02/18/2017  . Hypothyroidism 12/27/2015  . Family history of diabetes mellitus 08/06/2013  . History of vitamin D deficiency 08/06/2013  . Dysplasia of cervix, low grade (CIN 1) 07/31/2012   Past Medical History:  Diagnosis Date  . Dysplasia of cervix, low grade (CIN 1) 2006   LASER OF VAG AND CERVIX FOR CIN1 AND VAIN1  . Gonorrhea 2014   Positive culture  . STD (sexually transmitted disease) 09/2001, 12/2003   POSITIVE CHLAMYDIA  . Thyroid disease   . VAIN I (vaginal  intraepithelial neoplasia grade I) 2006   LASER OF CERVIX AND VAG/ VAIN 1 AND CIN1  . Vitamin D deficiency    Family History  Problem Relation Age of Onset  . Diabetes Father   . Alcohol abuse Father   . Diabetes Paternal Grandmother   . Hypertension Mother    Past Surgical History:  Procedure Laterality Date  . BREAST SURGERY  2008   REDUCTION MAMMOPLASTY  . CHOLECYSTECTOMY    . gonorrhea history     2014  . LASER VAPORIZATION OF CERVIX AND VAGINA  2008   [facility]--DR FERNANDEZ   Social History   Occupational History  . 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 Smoking status: Never Smoker  . Smokeless tobacco: Never Used  . Alcohol use Yes     Comment: RARE-SOCIALLY ONLY  . Drug use: No  . Sexual activity: Yes    Birth control/ protection: IUD     Comment: MIRENA

## 2017-03-02 NOTE — Assessment & Plan Note (Signed)
Patient presents with fairly classic Benadryl with marked weakness of the hip abductor's.  Since of time was spent discussing the pathophysiology and therapeutic exercises were reviewed in detail with athletic training staff.  Injection performed today for acute flareup.  Emphasized the importance of daily therapeutic exercises to help correct this.  Consider evaluation of gait and/or consideration of custom orthotics if any lack of improvement.

## 2017-03-11 ENCOUNTER — Encounter: Payer: Self-pay | Admitting: Family Medicine

## 2017-03-11 ENCOUNTER — Ambulatory Visit (INDEPENDENT_AMBULATORY_CARE_PROVIDER_SITE_OTHER): Payer: 59 | Admitting: Family Medicine

## 2017-03-11 VITALS — BP 126/78 | HR 95 | Temp 98.8°F | Ht 62.0 in | Wt 178.4 lb

## 2017-03-11 DIAGNOSIS — R69 Illness, unspecified: Secondary | ICD-10-CM | POA: Diagnosis not present

## 2017-03-11 DIAGNOSIS — J111 Influenza due to unidentified influenza virus with other respiratory manifestations: Secondary | ICD-10-CM

## 2017-03-11 DIAGNOSIS — J029 Acute pharyngitis, unspecified: Secondary | ICD-10-CM | POA: Diagnosis not present

## 2017-03-11 DIAGNOSIS — E669 Obesity, unspecified: Secondary | ICD-10-CM | POA: Insufficient documentation

## 2017-03-11 LAB — POCT RAPID STREP A (OFFICE): Rapid Strep A Screen: NEGATIVE

## 2017-03-11 MED ORDER — HYDROCODONE-HOMATROPINE 5-1.5 MG/5ML PO SYRP: 5.0000 mL | ORAL_SOLUTION | Freq: Every evening | ORAL | 0 refills | Status: DC | PRN

## 2017-03-11 NOTE — Patient Instructions (Addendum)
Viral Respiratory Infection  A viral respiratory infection is an illness that affects parts of the body used for breathing, like the lungs, nose, and throat. It is caused by a germ called a virus.  Some examples of this kind of infection are:  · A cold.  · The flu (influenza).  · A respiratory syncytial virus (RSV) infection.    How do I know if I have this infection?  Most of the time this infection causes:  · A stuffy or runny nose.  · Yellow or green fluid in the nose.  · A cough.  · Sneezing.  · Tiredness (fatigue).  · Achy muscles.  · A sore throat.  · Sweating or chills.  · A fever.  · A headache.    How is this infection treated?  If the flu is diagnosed early, it may be treated with an antiviral medicine. This medicine shortens the length of time a person has symptoms. Symptoms may be treated with over-the-counter and prescription medicines, such as:  · Expectorants. These make it easier to cough up mucus.  · Decongestant nasal sprays.    Doctors do not prescribe antibiotic medicines for viral infections. They do not work with this kind of infection.  How do I know if I should stay home?  To keep others from getting sick, stay home if you have:  · A fever.  · A lasting cough.  · A sore throat.  · A runny nose.  · Sneezing.  · Muscles aches.  · Headaches.  · Tiredness.  · Weakness.  · Chills.  · Sweating.  · An upset stomach (nausea).    Follow these instructions at home:  · Rest as much as possible.  · Take over-the-counter and prescription medicines only as told by your doctor.  · Drink enough fluid to keep your pee (urine) clear or pale yellow.  · Gargle with salt water. Do this 3-4 times per day or as needed. To make a salt-water mixture, dissolve ½-1 tsp of salt in 1 cup of warm water. Make sure the salt dissolves all the way.  · Use nose drops made from salt water. This helps with stuffiness (congestion). It also helps soften the skin around your nose.  · Do not drink alcohol.  · Do not use tobacco  products, including cigarettes, chewing tobacco, and e-cigarettes. If you need help quitting, ask your doctor.  Get help if:  · Your symptoms last for 10 days or longer.  · Your symptoms get worse over time.  · You have a fever.  · You have very bad pain in your face or forehead.  · Parts of your jaw or neck become very swollen.  Get help right away if:  · You feel pain or pressure in your chest.  · You have shortness of breath.  · You faint or feel like you will faint.  · You keep throwing up (vomiting).  · You feel confused.  This information is not intended to replace advice given to you by your health care provider. Make sure you discuss any questions you have with your health care provider.  Document Released: 11/28/2008 Document Revised: 05/23/2016 Document Reviewed: 05/24/2015  Elsevier Interactive Patient Education © 2017 Elsevier Inc.

## 2017-03-11 NOTE — Progress Notes (Signed)
Pre visit review using our clinic review tool, if applicable. No additional management support is needed unless otherwise documented below in the visit note. 

## 2017-03-11 NOTE — Progress Notes (Signed)
Brittany Morrow is a 31 y.o. female here for a new problem.  History of Present Illness:   Chief Complaint  Patient presents with  . Acute Visit  . Sore Throat    Sore Throat   This is a new problem. The current episode started in the past 7 days. The problem has been gradually worsening. The maximum temperature recorded prior to her arrival was 102 - 102.9 F. The fever has been present for less than 1 day. The pain is at a severity of 7/10. The pain is moderate. Associated symptoms include coughing and ear pain. Pertinent negatives include no abdominal pain, diarrhea or vomiting. She has tried acetaminophen for the symptoms. The treatment provided mild relief.   PMHx, SurgHx, SocialHx, Medications, and Allergies were reviewed in the Visit Navigator and updated as appropriate.  Current Medications:   .  cholecalciferol (VITAMIN D) 1000 UNITS tablet, Take 1,000 Units by mouth daily., Disp: , Rfl:  .  levothyroxine (SYNTHROID, LEVOTHROID) 50 MCG tablet, Take 1 tablet (50 mcg total) by mouth daily., Disp: 90 tablet, Rfl: 3 .  norethindrone-ethinyl estradiol (JUNEL FE,GILDESS FE,LOESTRIN FE) 1-20 MG-MCG tablet, Take 1 tablet by mouth daily., Disp: , Rfl:     Review of Systems:   Review of Systems  Constitutional: Positive for fever and malaise/fatigue.  HENT: Positive for ear pain and sore throat.   Eyes: Negative for blurred vision.  Respiratory: Positive for cough. Negative for wheezing.   Cardiovascular: Negative for chest pain, palpitations and leg swelling.  Gastrointestinal: Negative for abdominal pain, constipation, diarrhea, nausea and vomiting.  Genitourinary: Negative for dysuria.  Musculoskeletal: Positive for myalgias.  Neurological: Negative for dizziness.   Vitals:   Vitals:   03/11/17 1313  BP: 126/78  Pulse: 95  Temp: 98.8 F (37.1 C)  SpO2: 98%  Weight: 178 lb 6.4 oz (80.9 kg)  Height: 5\' 2"  (1.575 m)     Body mass index is 32.63 kg/m.  Physical Exam:    Physical Exam  Constitutional: She is oriented to person, place, and time. She appears well-developed and well-nourished. No distress.  HENT:  Head: Normocephalic and atraumatic.  Right Ear: External ear normal.  Left Ear: External ear normal.  Mouth/Throat: Posterior oropharyngeal erythema present.  Eyes: Pupils are equal, round, and reactive to light.  Neck: Neck supple.  Cardiovascular: Normal rate, regular rhythm, normal heart sounds and intact distal pulses.   Pulmonary/Chest: Effort normal and breath sounds normal.  Abdominal: Soft.  Lymphadenopathy:    She has no cervical adenopathy.  Neurological: She is alert and oriented to person, place, and time.  Skin: Skin is warm and dry.  Psychiatric: She has a normal mood and affect.   Results for orders placed or performed in visit on 03/11/17  POCT rapid strep A  Result Value Ref Range   Rapid Strep A Screen Negative Negative    Assessment and Plan:   Brittany Morrow was seen today for acute visit and sore throat.  Diagnoses and all orders for this visit:  Sore throat Comments: Rapid  strep negative. No exudates on exam. Recommend OTC Cepacol.  Orders: -     POCT rapid strep A  Influenza-like illness Comments: Symptomatic care and red flags reviewed. Orders: -     HYDROcodone-homatropine (HYCODAN) 5-1.5 MG/5ML syrup; Take 5 mLs by mouth at bedtime as needed for cough.  . Reviewed expectations re: course of current medical issues. . Discussed self-management of symptoms. . Outlined signs and symptoms indicating need for  more acute intervention. . Patient verbalized understanding and all questions were answered. . See orders for this visit as documented in the electronic medical record. . Patient received an After-Visit Summary.  Insurance claims handlerAmber Agner, CMA, acting as scribe for Dr. Earlene PlaterWallace.  Helane RimaErica Lyly Canizales, D.O.

## 2017-03-13 ENCOUNTER — Encounter: Payer: Self-pay | Admitting: Family Medicine

## 2017-03-13 ENCOUNTER — Ambulatory Visit (INDEPENDENT_AMBULATORY_CARE_PROVIDER_SITE_OTHER): Payer: 59 | Admitting: Family Medicine

## 2017-03-13 VITALS — BP 126/78 | HR 74 | Temp 98.2°F | Ht 62.0 in | Wt 177.8 lb

## 2017-03-13 DIAGNOSIS — R05 Cough: Secondary | ICD-10-CM

## 2017-03-13 DIAGNOSIS — R059 Cough, unspecified: Secondary | ICD-10-CM

## 2017-03-13 MED ORDER — PREDNISONE 5 MG PO TABS: ORAL_TABLET | ORAL | 0 refills | Status: DC

## 2017-03-13 MED ORDER — DOXYCYCLINE HYCLATE 100 MG PO TABS: 100.0000 mg | ORAL_TABLET | Freq: Two times a day (BID) | ORAL | 0 refills | Status: DC

## 2017-03-13 NOTE — Patient Instructions (Signed)
It was my pleasure to see you today!   1. Take your medications as prescribed.  2. Follow up as directed.  3. If your symptoms worsen or do not improve, please let us know. If you have any "RED FLAGS" that we reviewed today, please call 911 or proceed to the nearest Emergency Room.   Please contact us if you have any questions or concerns.   Dorotha Hirschi, D.O. [facility]  

## 2017-03-13 NOTE — Progress Notes (Signed)
Pre visit review using our clinic review tool, if applicable. No additional management support is needed unless otherwise documented below in the visit note. 

## 2017-03-13 NOTE — Progress Notes (Signed)
Brittany Morrow is a 31 y.o. female here for a worsening of a current problem.Marland Kitchen  History of Present Illness:   Chief Complaint  Patient presents with  . Acute Visit  . Sore Throat  . Cough  . Ear Pain    Sore Throat   This is a recurrent problem. The current episode started in the past 7 days. The problem has been gradually worsening. There has been no fever. The pain is at a severity of 8/10. The pain is moderate. Associated symptoms include congestion, coughing, ear pain and headaches. Pertinent negatives include no abdominal pain or vomiting. She has tried NSAIDs for the symptoms. The treatment provided no relief.  Cough  This is a recurrent problem. The current episode started in the past 7 days. The problem has been gradually worsening. The cough is productive of sputum. Associated symptoms include ear pain, headaches and a sore throat. Pertinent negatives include no chest pain, chills, fever or rash. She has tried prescription cough suppressant for the symptoms. The treatment provided mild relief.    PMHx, SurgHx, SocialHx, Medications, and Allergies were reviewed in the Visit Navigator and updated as appropriate.  Current Medications:   Current Outpatient Prescriptions:  .  cholecalciferol (VITAMIN D) 1000 UNITS tablet, Take 1,000 Units by mouth daily., Disp: , Rfl:  .  HYDROcodone-homatropine (HYCODAN) 5-1.5 MG/5ML syrup, Take 5 mLs by mouth at bedtime as needed for cough., Disp: 120 mL, Rfl: 0 .  levothyroxine (SYNTHROID, LEVOTHROID) 50 MCG tablet, Take 1 tablet (50 mcg total) by mouth daily., Disp: 90 tablet, Rfl: 3 .  norethindrone-ethinyl estradiol (JUNEL FE,GILDESS FE,LOESTRIN FE) 1-20 MG-MCG tablet, Take 1 tablet by mouth daily., Disp: , Rfl:     Review of Systems:   Review of Systems  Constitutional: Positive for malaise/fatigue. Negative for chills and fever.  HENT: Positive for congestion, ear pain and sore throat.   Eyes: Negative for blurred vision.  Respiratory:  Positive for cough and sputum production.   Cardiovascular: Negative for chest pain and palpitations.  Gastrointestinal: Positive for nausea. Negative for abdominal pain and vomiting.  Genitourinary: Negative for frequency.  Musculoskeletal: Negative for back pain.  Skin: Negative for rash.  Neurological: Positive for headaches. Negative for loss of consciousness.  Psychiatric/Behavioral: Negative for depression. The patient is not nervous/anxious.     Vitals:   Vitals:   03/13/17 0904  BP: 126/78  Pulse: 74  Temp: 98.2 F (36.8 C)  TempSrc: Oral  SpO2: 98%  Weight: 177 lb 12.8 oz (80.6 kg)  Height: 5\' 2"  (1.575 m)     Body mass index is 32.52 kg/m.  Physical Exam:   Physical Exam  Constitutional: She appears well-developed and well-nourished.  HENT:  Head: Normocephalic and atraumatic.  Right Ear: Tympanic membrane, external ear and ear canal normal.  Left Ear: Tympanic membrane, external ear and ear canal normal.  Mouth/Throat: Posterior oropharyngeal erythema present. No oropharyngeal exudate.  Cardiovascular: Normal rate and regular rhythm.   Pulmonary/Chest: She has rhonchi.  Abdominal: Soft.  Lymphadenopathy:    She has cervical adenopathy.  Skin: Skin is warm.  Psychiatric: She has a normal mood and affect.    Assessment and Plan:   [name] was seen today for acute visit, sore throat, cough and ear pain.  Diagnoses and all orders for this visit:  Cough -     doxycycline (VIBRA-TABS) 100 MG tablet; Take 1 tablet (100 mg total) by mouth 2 (two) times daily. -     predniSONE (DELTASONE)  5 MG tablet; 6-5-4-3-2-1-off  . Reviewed expectations re: course of current medical issues. . Discussed self-management of symptoms. . Outlined signs and symptoms indicating need for more acute intervention. . Patient verbalized understanding and all questions were answered. . See orders for this visit as documented in the electronic medical record. . Patient received an  After-Visit Summary.  Insurance claims handlerAmber Agner, CMA, acting as scribe for Dr. Earlene PlaterWallace.  CMA served as Neurosurgeonscribe during this visit. History, Physical, and Plan performed by medical provider. Documentation and orders reviewed and attested to. Helane RimaErica Monica Zahler, D.O.    Helane RimaErica Tamella Tuccillo, D.O.

## 2017-03-24 ENCOUNTER — Ambulatory Visit: Payer: 59 | Admitting: Sports Medicine

## 2017-05-15 MED FILL — LARIN FE 1-20 TABLET: 1-20 | 28 days supply | Qty: 28 | Fill #0

## 2017-05-29 ENCOUNTER — Telehealth: Payer: Self-pay | Admitting: Family Medicine

## 2017-05-29 NOTE — Telephone Encounter (Signed)
Spoke with patient.  Advised that 90 day supply with 3 refills was sent on 02/18/2017.  Patient should have refills remaining.  She states she will contact her pharmacy and call us back if she has any difficulty.

## 2017-05-29 NOTE — Telephone Encounter (Signed)
**  Remind patient they can make refill requests via MyChart**  Medication refill request (Name & Dosage):  levothyroxine (SYNTHROID, LEVOTHROID) 50 MCG tablet  Preferred pharmacy (Name & Address):   [facility] [place] - [address]  Other comments (if applicable):

## 2017-06-02 MED FILL — LEVOTHYROXINE 50 MCG TABLET: 50 | 90 days supply | Qty: 90 | Fill #1

## 2017-06-04 DIAGNOSIS — Z01419 Encounter for gynecological examination (general) (routine) without abnormal findings: Secondary | ICD-10-CM | POA: Diagnosis not present

## 2017-06-04 DIAGNOSIS — N760 Acute vaginitis: Secondary | ICD-10-CM | POA: Diagnosis not present

## 2017-06-04 DIAGNOSIS — Z1151 Encounter for screening for human papillomavirus (HPV): Secondary | ICD-10-CM | POA: Diagnosis not present

## 2017-06-04 DIAGNOSIS — Z6830 Body mass index (BMI) 30.0-30.9, adult: Secondary | ICD-10-CM | POA: Diagnosis not present

## 2017-06-04 MED FILL — TINIDAZOLE 500 MG TABLET: 500 | 2 days supply | Qty: 8 | Fill #0

## 2017-06-09 MED FILL — LARIN FE 1-20 TABLET: 1-20 | 84 days supply | Qty: 84 | Fill #0

## 2017-06-17 ENCOUNTER — Telehealth: Payer: 59 | Admitting: Family

## 2017-06-17 DIAGNOSIS — H01001 Unspecified blepharitis right upper eyelid: Secondary | ICD-10-CM

## 2017-06-17 MED ORDER — ERYTHROMYCIN 5 MG/GM OP OINT
1.0000 | TOPICAL_OINTMENT | Freq: Four times a day (QID) | OPHTHALMIC | 0 refills | Status: DC
Start: 2017-06-17 — End: 2019-04-02

## 2017-06-17 NOTE — Progress Notes (Signed)
We are sorry that you are not feeling well.  Here is how we plan to help!  Based on what you have shared with me it looks like you have conjunctivitis.  Conjunctivitis is a common inflammatory or infectious condition of the eye that is often referred to as "pink eye".  In most cases it is contagious (viral or bacterial). However, not all conjunctivitis requires antibiotics (ex. Allergic).  We have made appropriate suggestions for you based upon your presentation.  I have prescribed Erythromycin eye ointment.  Pink eye can be highly contagious.  It is typically spread through direct contact with secretions, or contaminated objects or surfaces that one may have touched.  Strict handwashing is suggested with soap and water is urged.  If not available, use alcohol based had sanitizer.  Avoid unnecessary touching of the eye.  If you wear contact lenses, you will need to refrain from wearing them until you see no white discharge from the eye for at least 24 hours after being on medication.  You should see symptom improvement in 1-2 days after starting the medication regimen.  Call us if symptoms are not improved in 1-2 days.  Home Care:  Wash your hands often!  Do not wear your contacts until you complete your treatment plan.  Avoid sharing towels, bed linen, personal items with a person who has pink eye.  See attention for anyone in your home with similar symptoms.  Get Help Right Away If:  Your symptoms do not improve.  You develop blurred or loss of vision.  Your symptoms worsen (increased discharge, pain or redness)  Your e-visit answers were reviewed by a board certified advanced clinical practitioner to complete your personal care plan.  Depending on the condition, your plan could have included both over the counter or prescription medications.  If there is a problem please reply  once you have received a response from your provider.  Your safety is important to us.  If you have drug  allergies check your prescription carefully.    You can use MyChart to ask questions about today's visit, request a non-urgent call back, or ask for a work or school excuse for 24 hours related to this e-Visit. If it has been greater than 24 hours you will need to follow up with your provider, or enter a new e-Visit to address those concerns.   You will get an e-mail in the next two days asking about your experience.  I hope that your e-visit has been valuable and will speed your recovery. Thank you for using e-visits.

## 2017-06-22 ENCOUNTER — Encounter (HOSPITAL_COMMUNITY): Payer: Self-pay | Admitting: Emergency Medicine

## 2017-06-22 ENCOUNTER — Ambulatory Visit (HOSPITAL_COMMUNITY)
Admission: EM | Admit: 2017-06-22 | Discharge: 2017-06-22 | Disposition: A | Discharge status: Home / Self Care | Payer: 59 | Attending: Family Medicine | Admitting: Family Medicine

## 2017-06-22 DIAGNOSIS — H01024 Squamous blepharitis left upper eyelid: Secondary | ICD-10-CM | POA: Diagnosis not present

## 2017-06-22 DIAGNOSIS — H01021 Squamous blepharitis right upper eyelid: Secondary | ICD-10-CM | POA: Diagnosis not present

## 2017-06-22 MED ORDER — HYDROXYZINE HCL 25 MG PO TABS: 25.0000 mg | ORAL_TABLET | Freq: Three times a day (TID) | ORAL | 0 refills | Status: DC

## 2017-06-22 MED ORDER — POLYETHYL GLYCOL-PROPYL GLYCOL 0.4-0.3 % OP GEL: 1.0000 "application " | Freq: Four times a day (QID) | OPHTHALMIC | 0 refills | Status: DC

## 2017-06-22 NOTE — ED Triage Notes (Signed)
[facility] with a complaint of swelling of both eyelids and itching and drainage x 3 weeks that have gotten worse. The patient stated that she did an E-visit this week and was prescribed Erithromycin that has not helped.

## 2017-06-22 NOTE — ED Provider Notes (Signed)
CSN: [id number]     Arrival date & time 06/22/17  1202 History   None    Chief Complaint  Patient presents with  . Eye Problem   (Consider location/radiation/quality/duration/timing/severity/associated sxs/prior Treatment) 31 yo female comes in with 3 week history of irritated and swollen eyelids. Patient states the swelling started on the right eye first, with extreme pruritis. She has also noticed crusting on her eyelids. She has tried artifical tear drops and Vaseline with no relief. She has also tried taking Claritin and using warm compresses with little relief. A few days ago, the swelling spread to the left eye, and she had an e-visit where she was prescribed erythromycin ointment. She states that her eyelids have been even more irritated and swollen. Denies vision changes, red eyes, discharge from eyes.  Denies photophobia, injury to eyes. She has history of seasonal allergies and dry eyes. Denies any URI symptoms.       Past Medical History:  Diagnosis Date  . Dysplasia of cervix, low grade (CIN 1) 2006   LASER OF VAG AND CERVIX FOR CIN1 AND VAIN1  . Gonorrhea 2014   Positive culture  . STD (sexually transmitted disease) 09/2001, 12/2003   POSITIVE CHLAMYDIA  . Thyroid disease   . VAIN I (vaginal intraepithelial neoplasia grade I) 2006   LASER OF CERVIX AND VAG/ VAIN 1 AND CIN1  . Vitamin D deficiency    Past Surgical History:  Procedure Laterality Date  . BREAST SURGERY  2008   REDUCTION MAMMOPLASTY  . CHOLECYSTECTOMY    . gonorrhea history     2014  . LASER VAPORIZATION OF CERVIX AND VAGINA  2008   [facility]--DR FERNANDEZ   Family History  Problem Relation Age of Onset  . Diabetes Father   . Alcohol abuse Father   . Diabetes Paternal Grandmother   . Hypertension Mother    Social History  Substance Use Topics  . Smoking status: Never Smoker  . Smokeless tobacco: Never Used  . Alcohol use Yes     Comment: RARE-SOCIALLY ONLY   OB History    Gravida Para Term  Preterm AB Living   1 1       1    SAB TAB Ectopic Multiple Live Births                 Review of Systems  Constitutional: Negative for diaphoresis, fatigue and fever.  HENT: Negative for congestion, ear discharge, ear pain, facial swelling, postnasal drip, rhinorrhea, sinus pain, sinus pressure, sneezing, sore throat and trouble swallowing.   Eyes: Positive for itching. Negative for photophobia, pain, discharge, redness and visual disturbance.  Respiratory: Negative for cough, shortness of breath and wheezing.   Cardiovascular: Negative for chest pain and palpitations.    Allergies  Tramadol  Home Medications   Prior to Admission medications   Medication Sig Start Date End Date Taking? Authorizing Provider  cholecalciferol (VITAMIN D) 1000 UNITS tablet Take 1,000 Units by mouth daily.   Yes [provider]  erythromycin [facility]) ophthalmic ointment Place 1 application into the right eye 4 (four) times daily. 06/17/17  Yes Worthy Rancher B, FNP  norethindrone-ethinyl estradiol (JUNEL FE,GILDESS FE,LOESTRIN FE) 1-20 MG-MCG tablet Take 1 tablet by mouth daily.   Yes [provider]  hydrOXYzine (ATARAX/VISTARIL) 25 MG tablet Take 1 tablet (25 mg total) by mouth 3 (three) times daily. 06/22/17   Cathie Hoops, Tonie Elsey V, PA-C  Polyethyl Glycol-Propyl Glycol (SYSTANE) 0.4-0.3 % GEL ophthalmic gel Place 1 application into both  eyes every 6 (six) hours. 06/22/17   Belinda FisherYu, Kammie Scioli V, PA-C   Meds Ordered and Administered this Visit  Medications - No data to display  BP 106/76 (BP Location: Right Arm)   Pulse 79   Temp 98.3 F (36.8 C) (Oral)   Resp 18   SpO2 100%  No data found.   Physical Exam  Constitutional: She is oriented to person, place, and time. She appears well-developed and well-nourished. No distress.  HENT:  Head: Normocephalic and atraumatic.  Eyes: Conjunctivae and EOM are normal. Pupils are equal, round, and reactive to light.  Eye lids swollen bilaterally. Dry with  erythema. No discharge seen. No hordeolum/chalazion noted. Erythromycin ointment around the eyelids, patient had cleaned off crusting prior to arrival.   Neurological: She is alert and oriented to person, place, and time.  Skin: Skin is warm and dry.  Psychiatric: She has a normal mood and affect. Her behavior is normal. Judgment normal.    Urgent Care Course     Procedures (including critical care time)  Labs Review Labs Reviewed - No data to display  Imaging Review No results found.      MDM   1. Squamous blepharitis of upper eyelids of both eyes    1. Discussed with patient history and exam most consistent with blepharitis. Possible sensitivity to erythromycin ointment. Patient to stop ointment and vaseline. Patient to gently wash eyelashes with warm water and baby shampoo. May use artificial tears/gel for dry eyes.  2. Start hydroxyzine 25mg  TID x 4 days for pruritis. Can also ice eyelid to decrease irritation.  3. Patient to follow up with ophthalmologist if symptoms not improving. Resources given to patient.    Belinda FisherYu, Symiah Nowotny V, PA-C 06/22/17 1327

## 2017-06-26 DIAGNOSIS — H01004 Unspecified blepharitis left upper eyelid: Secondary | ICD-10-CM | POA: Diagnosis not present

## 2017-06-26 DIAGNOSIS — H01011 Ulcerative blepharitis right upper eyelid: Secondary | ICD-10-CM | POA: Diagnosis not present

## 2017-09-03 MED FILL — LEVOTHYROXINE 50 MCG TABLET: 50 | 90 days supply | Qty: 90 | Fill #2

## 2017-09-03 MED FILL — LARIN FE 1-20 TABLET: 1-20 | 84 days supply | Qty: 84 | Fill #1

## 2017-10-31 IMAGING — DX DG FINGER INDEX 2+V*R*
3 series · 3 of 3 positions shown · non-contrast
Comparison: None.

CLINICAL DATA: Pain following injury

EXAM:
RIGHT SECOND FINGER 2+V

[finger ap]
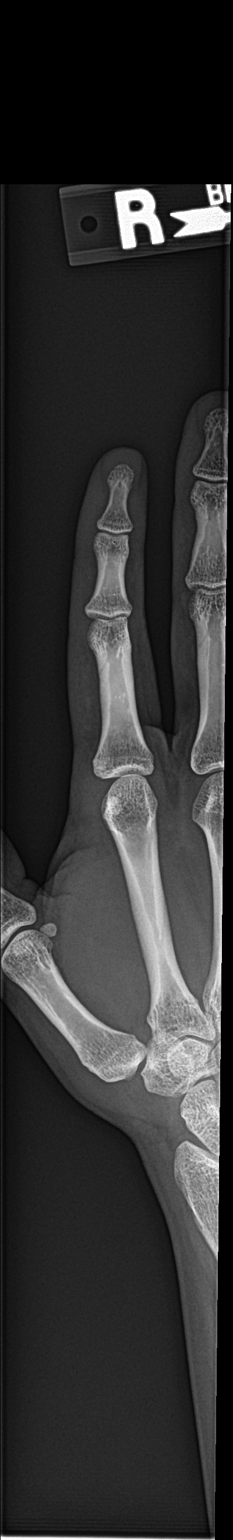

[finger obl]
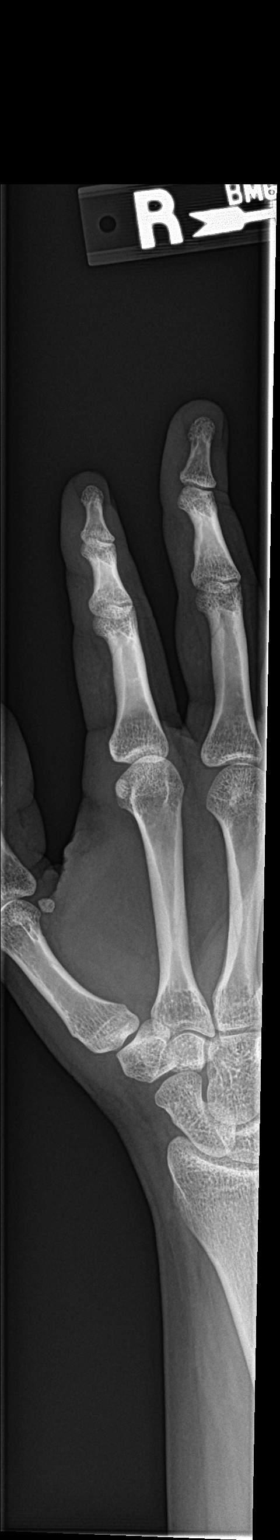

[finger lat]
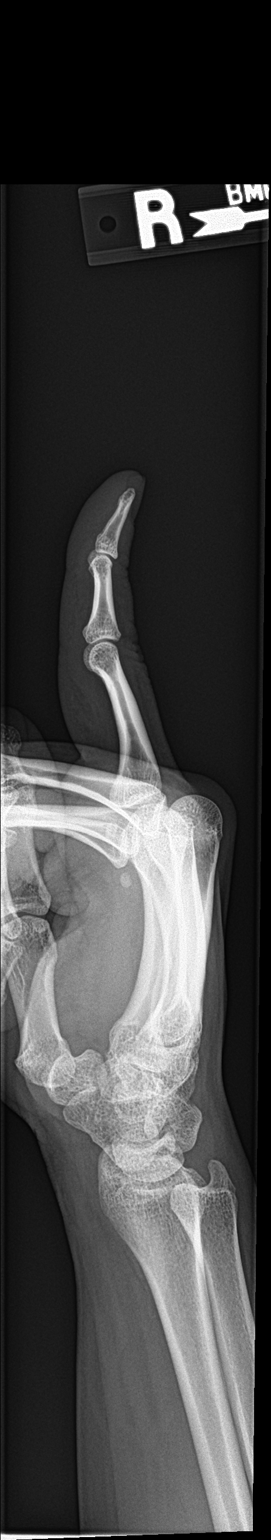

[3 of 3 positions shown; findings below may reference images not displayed]

FINDINGS: Frontal, oblique, and lateral views were obtained. There is no
fracture or dislocation. The joint spaces appear normal. No erosive
change.
IMPRESSION: No fracture or dislocation.  No apparent arthropathy.

## 2017-11-25 MED FILL — LARIN FE 1-20 TABLET: 1-20 | 84 days supply | Qty: 84 | Fill #2

## 2017-12-14 ENCOUNTER — Telehealth: Payer: 59 | Admitting: Family

## 2017-12-14 DIAGNOSIS — B9689 Other specified bacterial agents as the cause of diseases classified elsewhere: Secondary | ICD-10-CM | POA: Diagnosis not present

## 2017-12-14 DIAGNOSIS — B373 Candidiasis of vulva and vagina: Secondary | ICD-10-CM

## 2017-12-14 DIAGNOSIS — N760 Acute vaginitis: Secondary | ICD-10-CM | POA: Diagnosis not present

## 2017-12-14 DIAGNOSIS — B3731 Acute candidiasis of vulva and vagina: Secondary | ICD-10-CM

## 2017-12-14 MED ORDER — METRONIDAZOLE 500 MG PO TABS: 500.0000 mg | ORAL_TABLET | Freq: Two times a day (BID) | ORAL | 0 refills | Status: DC

## 2017-12-14 MED ORDER — FLUCONAZOLE 150 MG PO TABS: 150.0000 mg | ORAL_TABLET | Freq: Once | ORAL | 0 refills | Status: AC

## 2017-12-14 NOTE — Progress Notes (Signed)

## 2017-12-19 MED FILL — LEVOTHYROXINE 50 MCG TABLET: 50 | 90 days supply | Qty: 90 | Fill #3

## 2018-02-19 MED FILL — LARIN FE 1-20 TABLET: 1-20 | 84 days supply | Qty: 84 | Fill #3

## 2018-03-24 ENCOUNTER — Encounter: Payer: Self-pay | Admitting: Family Medicine

## 2018-03-24 ENCOUNTER — Ambulatory Visit (INDEPENDENT_AMBULATORY_CARE_PROVIDER_SITE_OTHER): Payer: No Typology Code available for payment source | Admitting: Family Medicine

## 2018-03-24 VITALS — BP 124/76 | HR 84 | Temp 98.4°F | Ht 62.0 in | Wt 161.8 lb

## 2018-03-24 DIAGNOSIS — E039 Hypothyroidism, unspecified: Secondary | ICD-10-CM

## 2018-03-24 DIAGNOSIS — E663 Overweight: Secondary | ICD-10-CM | POA: Diagnosis not present

## 2018-03-24 DIAGNOSIS — L301 Dyshidrosis [pompholyx]: Secondary | ICD-10-CM | POA: Diagnosis not present

## 2018-03-24 DIAGNOSIS — L239 Allergic contact dermatitis, unspecified cause: Secondary | ICD-10-CM | POA: Diagnosis not present

## 2018-03-24 DIAGNOSIS — Z0000 Encounter for general adult medical examination without abnormal findings: Secondary | ICD-10-CM | POA: Diagnosis not present

## 2018-03-24 DIAGNOSIS — E559 Vitamin D deficiency, unspecified: Secondary | ICD-10-CM | POA: Diagnosis not present

## 2018-03-24 LAB — VITAMIN D 25 HYDROXY (VIT D DEFICIENCY, FRACTURES): VITD: 18.93 ng/mL — ABNORMAL LOW (ref 30.00–100.00)

## 2018-03-24 LAB — LIPID PANEL
Cholesterol: 143 mg/dL (ref 0–200)
HDL: 54.4 mg/dL (ref >39.00)
LDL Cholesterol: 72 mg/dL (ref 0–99)
NonHDL: 88.99
Total CHOL/HDL Ratio: 3
Triglycerides: 86 mg/dL (ref 0.0–149.0)
VLDL: 17.2 mg/dL (ref 0.0–40.0)

## 2018-03-24 LAB — T4, FREE: Free T4: 0.77 ng/dL (ref 0.60–1.60)

## 2018-03-24 LAB — TSH: TSH: 5.59 u[IU]/mL — ABNORMAL HIGH (ref 0.35–4.50)

## 2018-03-24 MED ORDER — LEVOTHYROXINE SODIUM 50 MCG PO TABS: 50.0000 ug | ORAL_TABLET | Freq: Every day | ORAL | 3 refills | Status: DC

## 2018-03-24 MED ORDER — TRIAMCINOLONE ACETONIDE 0.5 % EX OINT: 1.0000 "application " | TOPICAL_OINTMENT | Freq: Two times a day (BID) | CUTANEOUS | 4 refills | Status: DC

## 2018-03-24 MED FILL — LEVOTHYROXINE 50 MCG TABLET: 50 | 90 days supply | Qty: 90 | Fill #0

## 2018-03-24 MED FILL — TRIAMCINOLONE 0.5% OINTMENT: 0.5 | 10 days supply | Qty: 15 | Fill #0

## 2018-03-24 NOTE — Progress Notes (Signed)
Subjective:    HARVEEN FLESCH is a 32 y.o. female and is here for a comprehensive physical exam.  PMHx, SurgHx, SocialHx, Medications, and Allergies were reviewed in the Visit Navigator and updated as appropriate.   Past Medical History:  Diagnosis Date  . Chlamydia 2002, 2005  . Gonorrhea 2014  . Hypothyroidism   . VAIN I (vaginal intraepithelial neoplasia grade I) 2006   LASER OF CERVIX AND VAG/ VAIN 1 AND CIN1  . Vitamin D deficiency    Past Surgical History:  Procedure Laterality Date  . CHOLECYSTECTOMY    . LASER VAPORIZATION OF CERVIX AND VAGINA  2008   [facility]--DR FERNANDEZ  . REDUCTION MAMMAPLASTY     Social History   Tobacco Use  . Smoking status: Never Smoker  . Smokeless tobacco: Never Used  Substance Use Topics  . Alcohol use: Yes    Comment: RARE-SOCIALLY ONLY  . Drug use: No   Current Outpatient Medications:  .  cholecalciferol (VITAMIN D) 1000 UNITS tablet, Take 1,000 Units by mouth daily., Disp: , Rfl:  .  levothyroxine (SYNTHROID, LEVOTHROID) 50 MCG tablet, Take 1 tablet (50 mcg total) by mouth daily before breakfast., Disp: 90 tablet, Rfl: 3 .  norethindrone-ethinyl estradiol (JUNEL FE,GILDESS FE,LOESTRIN FE) 1-20 MG-MCG tablet, Take 1 tablet by mouth daily., Disp: , Rfl:   Review of Systems:   Pertinent items are noted in the HPI. Otherwise, ROS is negative.  Objective:   BP 124/76   Pulse 84   Temp 98.4 F (36.9 C) (Oral)   Ht 5\' 2"  (1.575 m)   Wt 161 lb 12.8 oz (73.4 kg)   LMP 02/18/2018   SpO2 99%   BMI 29.59 kg/m    Wt Readings from Last 3 Encounters:  03/24/18 161 lb 12.8 oz (73.4 kg)  03/13/17 177 lb 12.8 oz (80.6 kg)  03/11/17 178 lb 6.4 oz (80.9 kg)     Ht Readings from Last 3 Encounters:  03/24/18 5\' 2"  (1.575 m)  03/13/17 5\' 2"  (1.575 m)  03/11/17 5\' 2"  (1.575 m)   General appearance: alert, cooperative and appears stated age. Head: normocephalic, without obvious abnormality, atraumatic. Neck: no adenopathy, supple,  symmetrical, trachea midline; thyroid not enlarged, symmetric, no tenderness/mass/nodules. Lungs: clear to auscultation bilaterally. Heart: regular rate and rhythm Abdomen: soft, non-tender; no masses,  no organomegaly. Extremities: extremities normal, atraumatic, no cyanosis or edema. Skin: skin color, texture, turgor normal, no rashes or lesions. Lymph: cervical, supraclavicular, and axillary nodes normal; no abnormal inguinal nodes palpated. Neurologic: grossly normal.  Assessment/Plan:   Nahomi was seen today for annual exam.  Diagnoses and all orders for this visit:  Routine physical examination  Acquired hypothyroidism Comments: No concerns. Appears euthyroid. Will recheck labs today. Orders: -     TSH -     levothyroxine (SYNTHROID, LEVOTHROID) 50 MCG tablet; Take 1 tablet (50 mcg total) by mouth daily before breakfast. -     T4, free  Vitamin D deficiency Comments: Will recheck labs today. Orders: -     VITAMIN D 25 Hydroxy (Vit-D Deficiency, Fractures)  Overweight (BMI 25.0-29.9) -     Lipid panel  Dyshidrotic eczema Comments: NEW. Reviewed Dx. Treatment as below. Orders: -     triamcinolone ointment (KENALOG) 0.5 %; Apply 1 application topically 2 (two) times daily.  Allergic contact dermatitis, unspecified trigger Comments: NEW. Reviewed Dx. Treatment as below.  Orders: -     triamcinolone ointment (KENALOG) 0.5 %; Apply 1 application topically 2 (two) times  daily.   Patient Counseling:   [x]     Nutrition: Stressed importance of moderation in sodium/caffeine intake, saturated fat and cholesterol, caloric balance, sufficient intake of fresh fruits, vegetables, fiber, calcium, iron, and 1 mg of folate supplement per day (for females capable of pregnancy).   [x]      Stressed the importance of regular exercise.    [x]     Substance Abuse: Discussed cessation/primary prevention of tobacco, alcohol, or other drug use; driving or other dangerous activities  under the influence; availability of treatment for abuse.    [x]      Injury prevention: Discussed safety belts, safety helmets, smoke detector, smoking near bedding or upholstery.    [x]      Sexuality: Discussed sexually transmitted diseases, partner selection, use of condoms, avoidance of unintended pregnancy  and contraceptive alternatives.    [x]     Dental health: Discussed importance of regular tooth brushing, flossing, and dental visits.   [x]      Health maintenance and immunizations reviewed. Please refer to Health maintenance section.   Helane RimaErica Kyrillos Adams, DO [facility]

## 2018-03-29 MED ORDER — LEVOTHYROXINE SODIUM 75 MCG PO TABS: 75.0000 ug | ORAL_TABLET | Freq: Every day | ORAL | 3 refills | Status: DC

## 2018-03-29 MED ORDER — CHOLECALCIFEROL 1.25 MG (50000 UT) PO TABS: ORAL_TABLET | ORAL | 0 refills | Status: DC

## 2018-03-29 NOTE — Addendum Note (Signed)
Addended by: Helane RimaWALLACE, Dyamond Tolosa R on: 03/29/2018 07:11 AM   Modules accepted: Orders

## 2018-04-07 MED FILL — metroNIDAZOLE 500 MG TABS: 500 | 7 days supply | Qty: 14 | Fill #0

## 2018-04-07 MED FILL — LEVOTHYROXINE 75 MCG TABLET: 75 | 90 days supply | Qty: 90 | Fill #0

## 2018-04-07 MED FILL — VITAMIN D3 50000 UNIT CAPS: 1.25 MG | 84 days supply | Qty: 12 | Fill #0

## 2018-05-01 MED FILL — CLINDAMYCIN HCL 300 MG CAPS: 300 | 7 days supply | Qty: 14 | Fill #0

## 2018-05-13 MED FILL — NORETHIN-ESTRAD-FERR 1-0.02: 1-20 | 28 days supply | Qty: 28 | Fill #4

## 2018-06-01 ENCOUNTER — Telehealth: Payer: Self-pay

## 2018-06-01 DIAGNOSIS — E039 Hypothyroidism, unspecified: Secondary | ICD-10-CM

## 2018-06-01 NOTE — Telephone Encounter (Signed)
Pt coming for labs 06/02/18. Please place future orders. Thank you.  

## 2018-06-01 NOTE — Telephone Encounter (Signed)
Labs have been placed

## 2018-06-02 ENCOUNTER — Other Ambulatory Visit (INDEPENDENT_AMBULATORY_CARE_PROVIDER_SITE_OTHER): Payer: No Typology Code available for payment source

## 2018-06-02 DIAGNOSIS — E039 Hypothyroidism, unspecified: Secondary | ICD-10-CM

## 2018-06-02 LAB — T4, FREE: Free T4: 0.78 ng/dL (ref 0.60–1.60)

## 2018-06-02 LAB — TSH: TSH: 4.16 u[IU]/mL (ref 0.35–4.50)

## 2018-06-04 ENCOUNTER — Other Ambulatory Visit: Payer: No Typology Code available for payment source

## 2018-06-10 MED FILL — NORETHIN-ESTRAD-FERR 1-0.02: 1-20 | 28 days supply | Qty: 28 | Fill #0

## 2018-07-09 MED FILL — LEVOTHYROXINE 75 MCG TABLET: 75 | 90 days supply | Qty: 90 | Fill #1

## 2018-07-09 MED FILL — NORETHIN-ESTRAD-FERR 1-0.02: 1-20 | 84 days supply | Qty: 84 | Fill #0

## 2018-09-30 MED FILL — NORETHIN-ESTRAD-FERR 1-0.02: 1-20 | 84 days supply | Qty: 84 | Fill #1

## 2018-10-02 ENCOUNTER — Telehealth: Payer: No Typology Code available for payment source | Admitting: Family

## 2018-10-02 DIAGNOSIS — B3731 Acute candidiasis of vulva and vagina: Secondary | ICD-10-CM

## 2018-10-02 DIAGNOSIS — B373 Candidiasis of vulva and vagina: Secondary | ICD-10-CM

## 2018-10-02 DIAGNOSIS — N760 Acute vaginitis: Secondary | ICD-10-CM | POA: Diagnosis not present

## 2018-10-02 DIAGNOSIS — B9689 Other specified bacterial agents as the cause of diseases classified elsewhere: Secondary | ICD-10-CM

## 2018-10-02 MED ORDER — METRONIDAZOLE 500 MG PO TABS: 500.0000 mg | ORAL_TABLET | Freq: Two times a day (BID) | ORAL | 0 refills | Status: DC

## 2018-10-02 MED ORDER — FLUCONAZOLE 150 MG PO TABS: 150.0000 mg | ORAL_TABLET | Freq: Once | ORAL | 0 refills | Status: AC

## 2018-10-02 MED FILL — FLUCONAZOLE 150 MG TABS: 150 | 9 days supply | Qty: 3 | Fill #0

## 2018-10-02 MED FILL — metroNIDAZOLE 500 MG TABS: 500 | 7 days supply | Qty: 14 | Fill #0

## 2018-10-02 NOTE — Progress Notes (Signed)
Thank you for the details you included in the comment boxes. Those details are very helpful in determining the best course of treatment for you and help [place] to provide the best care. I am treating you for BV and I will also send Diflucan for the white/milky discharge which may be a yeast infection in addition to the BV.  We are sorry that you are not feeling well. Here is how we plan to help! Based on what you shared with me it looks like you: May have a vaginosis due to bacteria  Vaginosis is an inflammation of the vagina that can result in discharge, itching and pain. The cause is usually a change in the normal balance of vaginal bacteria or an infection. Vaginosis can also result from reduced estrogen levels after menopause.  The most common causes of vaginosis are:   Bacterial vaginosis which results from an overgrowth of one on several organisms that are normally present in your vagina.   Yeast infections which are caused by a naturally occurring fungus called candida.   Vaginal atrophy (atrophic vaginosis) which results from the thinning of the vagina from reduced estrogen levels after menopause.   Trichomoniasis which is caused by a parasite and is commonly transmitted by sexual intercourse.  Factors that increase your risk of developing vaginosis include: Marland Kitchen Medications, such as antibiotics and steroids . Uncontrolled diabetes . Use of hygiene products such as bubble bath, vaginal spray or vaginal deodorant . Douching . Wearing damp or tight-fitting clothing . Using an intrauterine device (IUD) for birth control . Hormonal changes, such as those associated with pregnancy, birth control pills or menopause . Sexual activity . Having a sexually transmitted infection  Your treatment plan is Metronidazole or Flagyl 500mg  twice a day for 7 days.  I have electronically sent this prescription into the pharmacy that you have chosen.  Be sure to take all of the medication as directed. Stop  taking any medication if you develop a rash, tongue swelling or shortness of breath. Mothers who are breast feeding should consider pumping and discarding their breast milk while on these antibiotics. However, there is no consensus that infant exposure at these doses would be harmful.  Remember that medication creams can weaken latex condoms. Marland Kitchen   HOME CARE:  Good hygiene may prevent some types of vaginosis from recurring and may relieve some symptoms:  . Avoid baths, hot tubs and whirlpool spas. Rinse soap from your outer genital area after a shower, and dry the area well to prevent irritation. Don't use scented or harsh soaps, such as those with deodorant or antibacterial action. Marland Kitchen Avoid irritants. These include scented tampons and pads. . Wipe from front to back after using the toilet. Doing so avoids spreading fecal bacteria to your vagina.  Other things that may help prevent vaginosis include:  Marland Kitchen Don't douche. Your vagina doesn't require cleansing other than normal bathing. Repetitive douching disrupts the normal organisms that reside in the vagina and can actually increase your risk of vaginal infection. Douching won't clear up a vaginal infection. . Use a latex condom. Both female and female latex condoms may help you avoid infections spread by sexual contact. . Wear cotton underwear. Also wear pantyhose with a cotton crotch. If you feel comfortable without it, skip wearing underwear to bed. Yeast thrives in [employer] Your symptoms should improve in the next day or two.  GET HELP RIGHT AWAY IF:  . You have pain in your lower abdomen ( pelvic area  or over your ovaries) . You develop nausea or vomiting . You develop a fever . Your discharge changes or worsens . You have persistent pain with intercourse . You develop shortness of breath, a rapid pulse, or you faint.  These symptoms could be signs of problems or infections that need to be evaluated by a medical provider  now.  MAKE SURE YOU    Understand these instructions.  Will watch your condition.  Will get help right away if you are not doing well or get worse.  Your e-visit answers were reviewed by a board certified advanced clinical practitioner to complete your personal care plan. Depending upon the condition, your plan could have included both over the counter or prescription medications. Please review your pharmacy choice to make sure that you have choses a pharmacy that is open for you to pick up any needed prescription, Your safety is important to [place]. If you have drug allergies check your prescription carefully.   You can use MyChart to ask questions about today's visit, request a non-urgent call back, or ask for a work or school excuse for 24 hours related to this e-Visit. If it has been greater than 24 hours you will need to follow up with your provider, or enter a new e-Visit to address those concerns. You will get a MyChart message within the next two days asking about your experience. I hope that your e-visit has been valuable and will speed your recovery.

## 2018-10-14 MED FILL — metroNIDAZOLE 0.75 % GEL: 0.75 | 30 days supply | Qty: 70 | Fill #0

## 2018-10-14 MED FILL — TINIDAZOLE 500 MG TABS: 500 | 5 days supply | Qty: 10 | Fill #0

## 2018-11-24 MED FILL — FLUCONAZOLE 150 MG TABS: 150 | 21 days supply | Qty: 3 | Fill #0

## 2018-11-24 MED FILL — CLINDAMYCIN HCL 300 MG CAP: 300 | 7 days supply | Qty: 14 | Fill #0

## 2018-11-25 MED FILL — metroNIDAZOLE 0.75 % GEL: 0.75 | 30 days supply | Qty: 70 | Fill #1

## 2018-12-24 MED FILL — LEVOTHYROXINE 75 MCG TABLET: 75 | 90 days supply | Qty: 90 | Fill #2

## 2018-12-24 MED FILL — LARIN FE 1-20 TABLET: 1-20 | 84 days supply | Qty: 84 | Fill #2

## 2019-03-10 ENCOUNTER — Telehealth: Payer: No Typology Code available for payment source | Admitting: Nurse Practitioner

## 2019-03-10 DIAGNOSIS — B373 Candidiasis of vulva and vagina: Secondary | ICD-10-CM

## 2019-03-10 DIAGNOSIS — B3731 Acute candidiasis of vulva and vagina: Secondary | ICD-10-CM

## 2019-03-10 MED ORDER — FLUCONAZOLE 150 MG PO TABS: 150.0000 mg | ORAL_TABLET | Freq: Once | ORAL | 0 refills | Status: AC

## 2019-03-10 NOTE — Progress Notes (Signed)
We are sorry that you are not feeling well. Here is how we plan to help! Based on what you shared with me it looks like you: May have a yeast vaginosis  Vaginosis is an inflammation of the vagina that can result in discharge, itching and pain. The cause is usually a change in the normal balance of vaginal bacteria or an infection. Vaginosis can also result from reduced estrogen levels after menopause.  The most common causes of vaginosis are:   Bacterial vaginosis which results from an overgrowth of one on several organisms that are normally present in your vagina.   Yeast infections which are caused by a naturally occurring fungus called candida.   Vaginal atrophy (atrophic vaginosis) which results from the thinning of the vagina from reduced estrogen levels after menopause.   Trichomoniasis which is caused by a parasite and is commonly transmitted by sexual intercourse.  Factors that increase your risk of developing vaginosis include: . Medications, such as antibiotics and steroids . Uncontrolled diabetes . Use of hygiene products such as bubble bath, vaginal spray or vaginal deodorant . Douching . Wearing damp or tight-fitting clothing . Using an intrauterine device (IUD) for birth control . Hormonal changes, such as those associated with pregnancy, birth control pills or menopause . Sexual activity . Having a sexually transmitted infection  Your treatment plan is A single Diflucan (fluconazole) 150mg tablet once.  I have electronically sent this prescription into the pharmacy that you have chosen.  Be sure to take all of the medication as directed. Stop taking any medication if you develop a rash, tongue swelling or shortness of breath. Mothers who are breast feeding should consider pumping and discarding their breast milk while on these antibiotics. However, there is no consensus that infant exposure at these doses would be harmful.  Remember that medication creams can weaken latex  condoms. .   HOME CARE:  Good hygiene may prevent some types of vaginosis from recurring and may relieve some symptoms:  . Avoid baths, hot tubs and whirlpool spas. Rinse soap from your outer genital area after a shower, and dry the area well to prevent irritation. Don't use scented or harsh soaps, such as those with deodorant or antibacterial action. . Avoid irritants. These include scented tampons and pads. . Wipe from front to back after using the toilet. Doing so avoids spreading fecal bacteria to your vagina.  Other things that may help prevent vaginosis include:  . Don't douche. Your vagina doesn't require cleansing other than normal bathing. Repetitive douching disrupts the normal organisms that reside in the vagina and can actually increase your risk of vaginal infection. Douching won't clear up a vaginal infection. . Use a latex condom. Both female and female latex condoms may help you avoid infections spread by sexual contact. . Wear cotton underwear. Also wear pantyhose with a cotton crotch. If you feel comfortable without it, skip wearing underwear to bed. Yeast thrives in moist environments Your symptoms should improve in the next day or two.  GET HELP RIGHT AWAY IF:  . You have pain in your lower abdomen ( pelvic area or over your ovaries) . You develop nausea or vomiting . You develop a fever . Your discharge changes or worsens . You have persistent pain with intercourse . You develop shortness of breath, a rapid pulse, or you faint.  These symptoms could be signs of problems or infections that need to be evaluated by a medical provider now.  MAKE SURE YOU      Understand these instructions.  Will watch your condition.  Will get help right away if you are not doing well or get worse.  Your e-visit answers were reviewed by a board certified advanced clinical practitioner to complete your personal care plan. Depending upon the condition, your plan could have included  both over the counter or prescription medications. Please review your pharmacy choice to make sure that you have choses a pharmacy that is open for you to pick up any needed prescription, Your safety is important to us. If you have drug allergies check your prescription carefully.   You can use MyChart to ask questions about today's visit, request a non-urgent call back, or ask for a work or school excuse for 24 hours related to this e-Visit. If it has been greater than 24 hours you will need to follow up with your provider, or enter a new e-Visit to address those concerns. You will get a MyChart message within the next two days asking about your experience. I hope that your e-visit has been valuable and will speed your recovery.  5 minutes spent reviewing and documenting in chart.  

## 2019-03-18 MED FILL — NORETHIN-ESTRAD-FERR 1-0.02: 1-20 | 84 days supply | Qty: 84 | Fill #3

## 2019-03-30 ENCOUNTER — Other Ambulatory Visit: Payer: Self-pay | Admitting: Family Medicine

## 2019-03-31 MED FILL — LEVOTHYROXINE 75 MCG TABLET: 75 | 90 days supply | Qty: 90 | Fill #0

## 2019-03-31 NOTE — Telephone Encounter (Signed)
Needs app

## 2019-04-01 NOTE — Progress Notes (Signed)
Virtual Visit via Video   I connected with Brittany Morrow on 04/02/19 at 11:20 AM EDT by a video enabled telemedicine application and verified that I am speaking with the correct person using two identifiers. Location patient: Home Location provider: [facility], Office Persons participating in the virtual visit: Donnal Debar, DO   I discussed the limitations of evaluation and management by telemedicine and the availability of in person appointments. The patient expressed understanding and agreed to proceed.  Subjective:   HPI: Patient in office for follow. She needed to have refills but has not been seen in the office. She is having some improvement with the of thyroid medications. She is still having some issues with fatigue. She will come in for follow up in 3 months for labs.   Reviewed all precautions and expectations with prevention of Covid-19  ROS: See pertinent positives and negatives per HPI.  Patient Active Problem List   Diagnosis Date Noted  . Obesity (BMI 30.0-34.9) 03/11/2017  . Iliotibial band syndrome of left side 02/18/2017  . Hypothyroidism 12/27/2015  . Family history of diabetes mellitus 08/06/2013  . History of vitamin D deficiency 08/06/2013  . Dysplasia of cervix, low grade (CIN 1) 07/31/2012    Social History   Tobacco Use  . Smoking status: Never Smoker  . Smokeless tobacco: Never Used  Substance Use Topics  . Alcohol use: Yes    Comment: RARE-SOCIALLY ONLY    Current Outpatient Medications:  .  cholecalciferol (VITAMIN D) 1000 UNITS tablet, Take 1,000 Units by mouth daily., Disp: , Rfl:  .  levothyroxine (SYNTHROID, LEVOTHROID) 75 MCG tablet, Take 1 tablet (75 mcg total) by mouth daily., Disp: 90 tablet, Rfl: 0 .  norethindrone-ethinyl estradiol (JUNEL FE,GILDESS FE,LOESTRIN FE) 1-20 MG-MCG tablet, Take 1 tablet by mouth daily., Disp: 4 Package, Rfl: 3 .  triamcinolone ointment (KENALOG) 0.5 %, Apply 1 application  topically 2 (two) times daily., Disp: 15 g, Rfl: 4  Allergies  Allergen Reactions  . Tramadol Itching    Objective:   VITALS: Per patient if applicable, see vitals. GENERAL: Alert, appears well and in no acute distress. HEENT: Atraumatic, conjunctiva clear, no obvious abnormalities on inspection of external nose and ears. NECK: Normal movements of the head and neck. CARDIOPULMONARY: No increased WOB. Speaking in clear sentences. I:E ratio WNL.  MS: Moves all visible extremities without noticeable abnormality. PSYCH: Pleasant and cooperative, well-groomed. Speech normal rate and rhythm. Affect is appropriate. Insight and judgement are appropriate. Attention is focused, linear, and appropriate.  NEURO: CN grossly intact. Oriented as arrived to appointment on time with no prompting. Moves both UE equally.  SKIN: No obvious lesions, wounds, erythema, or cyanosis noted on face or hands.  Assessment and Plan:   Diagnoses and all orders for this visit:  Acquired hypothyroidism -     levothyroxine (SYNTHROID, LEVOTHROID) 75 MCG tablet; Take 1 tablet (75 mcg total) by mouth daily.  Other orders -     norethindrone-ethinyl estradiol (JUNEL FE,GILDESS FE,LOESTRIN FE) 1-20 MG-MCG tablet; Take 1 tablet by mouth daily.    . Reviewed expectations re: course of current medical issues. . Discussed self-management of symptoms. . Outlined signs and symptoms indicating need for more acute intervention. . Patient verbalized understanding and all questions were answered. Marland Kitchen Health Maintenance issues including appropriate healthy diet, exercise, and smoking avoidance were discussed with patient. . See orders for this visit as documented in the electronic medical record.  Helane Rima, DO 04/02/2019

## 2019-04-02 ENCOUNTER — Ambulatory Visit (INDEPENDENT_AMBULATORY_CARE_PROVIDER_SITE_OTHER): Payer: No Typology Code available for payment source | Admitting: Family Medicine

## 2019-04-02 ENCOUNTER — Encounter: Payer: Self-pay | Admitting: Family Medicine

## 2019-04-02 ENCOUNTER — Other Ambulatory Visit: Payer: Self-pay

## 2019-04-02 DIAGNOSIS — E039 Hypothyroidism, unspecified: Secondary | ICD-10-CM

## 2019-04-02 MED ORDER — NORETHIN ACE-ETH ESTRAD-FE 1-20 MG-MCG PO TABS: 1.0000 | ORAL_TABLET | Freq: Every day | ORAL | 3 refills | Status: DC

## 2019-04-02 MED ORDER — LEVOTHYROXINE SODIUM 75 MCG PO TABS: 75.0000 ug | ORAL_TABLET | Freq: Every day | ORAL | 0 refills | Status: DC

## 2019-06-09 MED FILL — BLISOVI FE 1/20 1-20 MG-MCG: 1-20 | 28 days supply | Qty: 28 | Fill #4

## 2019-06-25 MED FILL — CHLORHEXIDINE 0.12% RINSE: 0.12 | 16 days supply | Qty: 473 | Fill #0

## 2019-06-25 MED FILL — HYDROCODON-APAP 5-325: 5-325 | 2 days supply | Qty: 5 | Fill #0

## 2019-06-25 MED FILL — AMOXICILLIN 500 MG CAPSULE: 500 | 7 days supply | Qty: 21 | Fill #0

## 2019-06-25 MED FILL — IBUPROFEN 400 MG TABS: 400 | 5 days supply | Qty: 30 | Fill #0

## 2019-06-25 MED FILL — DEXAMETHASONE 4 MG TABLET: 4 | 3 days supply | Qty: 9 | Fill #0

## 2019-07-06 ENCOUNTER — Encounter: Payer: Self-pay | Admitting: Family Medicine

## 2019-07-06 ENCOUNTER — Other Ambulatory Visit: Payer: Self-pay

## 2019-07-06 ENCOUNTER — Ambulatory Visit (INDEPENDENT_AMBULATORY_CARE_PROVIDER_SITE_OTHER): Payer: No Typology Code available for payment source | Admitting: Family Medicine

## 2019-07-06 VITALS — BP 110/78 | HR 73 | Temp 98.0°F | Ht 62.0 in | Wt 167.4 lb

## 2019-07-06 DIAGNOSIS — R635 Abnormal weight gain: Secondary | ICD-10-CM

## 2019-07-06 DIAGNOSIS — R739 Hyperglycemia, unspecified: Secondary | ICD-10-CM

## 2019-07-06 DIAGNOSIS — Z0000 Encounter for general adult medical examination without abnormal findings: Secondary | ICD-10-CM

## 2019-07-06 DIAGNOSIS — E559 Vitamin D deficiency, unspecified: Secondary | ICD-10-CM | POA: Diagnosis not present

## 2019-07-06 DIAGNOSIS — E663 Overweight: Secondary | ICD-10-CM

## 2019-07-06 DIAGNOSIS — Z833 Family history of diabetes mellitus: Secondary | ICD-10-CM | POA: Diagnosis not present

## 2019-07-06 DIAGNOSIS — R1013 Epigastric pain: Secondary | ICD-10-CM

## 2019-07-06 DIAGNOSIS — R5383 Other fatigue: Secondary | ICD-10-CM

## 2019-07-06 DIAGNOSIS — E039 Hypothyroidism, unspecified: Secondary | ICD-10-CM

## 2019-07-06 DIAGNOSIS — Z13220 Encounter for screening for lipoid disorders: Secondary | ICD-10-CM

## 2019-07-06 DIAGNOSIS — E538 Deficiency of other specified B group vitamins: Secondary | ICD-10-CM | POA: Diagnosis not present

## 2019-07-06 LAB — LIPID PANEL
Cholesterol: 210 mg/dL — ABNORMAL HIGH (ref 0–200)
HDL: 62.5 mg/dL (ref >39.00)
LDL Cholesterol: 115 mg/dL — ABNORMAL HIGH (ref 0–99)
NonHDL: 147.32
Total CHOL/HDL Ratio: 3
Triglycerides: 161 mg/dL — ABNORMAL HIGH (ref 0.0–149.0)
VLDL: 32.2 mg/dL (ref 0.0–40.0)

## 2019-07-06 LAB — COMPREHENSIVE METABOLIC PANEL
ALT: 24 U/L (ref 0–35)
AST: 21 U/L (ref 0–37)
Albumin: 4.2 g/dL (ref 3.5–5.2)
Alkaline Phosphatase: 50 U/L (ref 39–117)
BUN: 15 mg/dL (ref 6–23)
CO2: 26 mEq/L (ref 19–32)
Calcium: 8.9 mg/dL (ref 8.4–10.5)
Chloride: 106 mEq/L (ref 96–112)
Creatinine, Ser: 0.61 mg/dL (ref 0.40–1.20)
GFR: 113.15 mL/min (ref >60.00)
Glucose, Bld: 92 mg/dL (ref 70–99)
Potassium: 4.1 mEq/L (ref 3.5–5.1)
Sodium: 139 mEq/L (ref 135–145)
Total Bilirubin: 0.5 mg/dL (ref 0.2–1.2)
Total Protein: 6.9 g/dL (ref 6.0–8.3)

## 2019-07-06 LAB — CBC WITH DIFFERENTIAL/PLATELET
Basophils Absolute: 0 10*3/uL (ref 0.0–0.1)
Basophils Relative: 0.3 % (ref 0.0–3.0)
Eosinophils Absolute: 0.1 10*3/uL (ref 0.0–0.7)
Eosinophils Relative: 2.2 % (ref 0.0–5.0)
HCT: 40.1 % (ref 36.0–46.0)
Hemoglobin: 13.1 g/dL (ref 12.0–15.0)
Lymphocytes Relative: 29.6 % (ref 12.0–46.0)
Lymphs Abs: 1.8 10*3/uL (ref 0.7–4.0)
MCHC: 32.6 g/dL (ref 30.0–36.0)
MCV: 87.3 fl (ref 78.0–100.0)
Monocytes Absolute: 0.4 10*3/uL (ref 0.1–1.0)
Monocytes Relative: 7.2 % (ref 3.0–12.0)
Neutro Abs: 3.6 10*3/uL (ref 1.4–7.7)
Neutrophils Relative %: 60.7 % (ref 43.0–77.0)
Platelets: 222 10*3/uL (ref 150.0–400.0)
RBC: 4.59 Mil/uL (ref 3.87–5.11)
RDW: 14 % (ref 11.5–15.5)
WBC: 6 10*3/uL (ref 4.0–10.5)

## 2019-07-06 LAB — TSH: TSH: 9.41 u[IU]/mL — ABNORMAL HIGH (ref 0.35–4.50)

## 2019-07-06 LAB — HEMOGLOBIN A1C: Hgb A1c MFr Bld: 5.4 % (ref 4.6–6.5)

## 2019-07-06 LAB — VITAMIN D 25 HYDROXY (VIT D DEFICIENCY, FRACTURES): VITD: 24.19 ng/mL — ABNORMAL LOW (ref 30.00–100.00)

## 2019-07-06 LAB — T4, FREE: Free T4: 0.63 ng/dL (ref 0.60–1.60)

## 2019-07-06 LAB — VITAMIN B12: Vitamin B-12: 231 pg/mL (ref 211–911)

## 2019-07-06 MED ORDER — SAXENDA 18 MG/3ML ~~LOC~~ SOPN: 3.0000 mg | PEN_INJECTOR | Freq: Every day | SUBCUTANEOUS | 3 refills | Status: DC

## 2019-07-06 MED ORDER — ESOMEPRAZOLE MAGNESIUM 40 MG PO CPDR: 40.0000 mg | DELAYED_RELEASE_CAPSULE | Freq: Every day | ORAL | 3 refills | Status: DC

## 2019-07-06 MED ORDER — CYANOCOBALAMIN 1000 MCG/ML IJ SOLN
1000.0000 ug | Freq: Once | INTRAMUSCULAR | Status: AC
  Administered 2019-07-06: 1000 ug via INTRAMUSCULAR

## 2019-07-06 MED FILL — BLISOVI FE 1/20 1-20 MG-MCG: 1-20 | 84 days supply | Qty: 84 | Fill #0

## 2019-07-06 MED FILL — FLUCONAZOLE 150 MG TABS: 150 | 1 days supply | Qty: 1 | Fill #0

## 2019-07-06 NOTE — Progress Notes (Signed)
Subjective:    Brittany GreenYari L Morrow is a 33 y.o. female and is here for a comprehensive physical exam, specifically requesting to have her thyroid labs checked today and concerned about strong family h/o diabetes-would like her a1c checked today.  Patient is fasting this morning.  Current Outpatient Medications:  .  cholecalciferol (VITAMIN D) 1000 UNITS tablet, Take 1,000 Units by mouth daily., Disp: , Rfl:  .  levothyroxine (SYNTHROID, LEVOTHROID) 75 MCG tablet, Take 1 tablet (75 mcg total) by mouth daily., Disp: 90 tablet, Rfl: 0 .  norethindrone-ethinyl estradiol (JUNEL FE,GILDESS FE,LOESTRIN FE) 1-20 MG-MCG tablet, Take 1 tablet by mouth daily., Disp: 4 Package, Rfl: 3 .  triamcinolone ointment (KENALOG) 0.5 %, Apply 1 application topically 2 (two) times daily., Disp: 15 g, Rfl: 4 .  esomeprazole (NEXIUM) 40 MG capsule, Take 1 capsule (40 mg total) by mouth daily., Disp: 30 capsule, Rfl: 3 .  Liraglutide -Weight Management (SAXENDA) 18 MG/3ML SOPN, Inject 3 mg into the skin daily. 0.6 mg qd x 1 week, then 1.2 mg qd x 1 week, then 1.8 mg qd x 1 week, then 2.4 mg qd x 1 week, then 3 mg qd, Disp: 3 pen, Rfl: 3  PMHx, SurgHx, SocialHx, Medications, and Allergies were reviewed in the Visit Navigator and updated as appropriate.   Past Medical History:  Diagnosis Date  . Chlamydia 2002, 2005  . Gonorrhea 2014  . Hypothyroidism   . VAIN I (vaginal intraepithelial neoplasia grade I) 2006   LASER OF CERVIX AND VAG/ VAIN 1 AND CIN1  . Vitamin D deficiency      Past Surgical History:  Procedure Laterality Date  . CHOLECYSTECTOMY    . LASER VAPORIZATION OF CERVIX AND VAGINA  2008   [facility]--DR FERNANDEZ  . REDUCTION MAMMAPLASTY       Family History  Problem Relation Age of Onset  . Diabetes Father   . Alcohol abuse Father   . Diabetes Paternal Grandmother   . Hypertension Mother    Social History   Tobacco Use  . Smoking status: Never Smoker  . Smokeless tobacco: Never Used    Substance Use Topics  . Alcohol use: Yes    Comment: RARE-SOCIALLY ONLY  . Drug use: No    Review of Systems:   Pertinent items are noted in the HPI. Otherwise, ROS is negative.  Objective:   BP 110/78 (BP Location: Left Arm, Patient Position: Sitting, Cuff Size: Normal)   Pulse 73   Temp 98 F (36.7 C) (Oral)   Ht 5\' 2"  (1.575 m)   Wt 167 lb 6.4 oz (75.9 kg)   LMP 06/04/2019 (Exact Date)   SpO2 100%   BMI 30.62 kg/m    General appearance: alert, cooperative and appears stated age. Head: normocephalic, without obvious abnormality, atraumatic. Neck: no adenopathy, supple, symmetrical, trachea midline; thyroid not enlarged, symmetric, no tenderness/mass/nodules. Lungs: clear to auscultation bilaterally. Heart: regular rate and rhythm Abdomen: soft, non-tender; no masses,  no organomegaly. Extremities: extremities normal, atraumatic, no cyanosis or edema. Skin: skin color, texture, turgor normal, no rashes or lesions. Lymph: cervical, supraclavicular, and axillary nodes normal; no abnormal inguinal nodes palpated. Neurologic: grossly normal.  Assessment/Plan:   Brittany Morrow was seen today for annual exam.  Diagnoses and all orders for this visit:  Routine physical examination  Acquired hypothyroidism -     TSH -     T4, free  Vitamin D deficiency -     VITAMIN D 25 Hydroxy (Vit-D Deficiency, Fractures)  Overweight (BMI 25.0-29.9)  Screening for lipid disorders -     Lipid panel  Fatigue, unspecified type -     CBC with Differential/Platelet -     Comprehensive metabolic panel  Family history of diabetes mellitus -     Hemoglobin A1c  Hyperglycemia -     Hemoglobin A1c  B12 deficiency -     Vitamin B12 -     cyanocobalamin ((VITAMIN B-12)) injection 1,000 mcg  Dyspepsia -     esomeprazole (NEXIUM) 40 MG capsule; Take 1 capsule (40 mg total) by mouth daily.  Weight gain Patient wants to work on weight loss. Interested in [place]. 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  Wt Readings from Last 3 Encounters:  07/06/19 167 lb 6.4 oz (75.9 kg)  03/24/18 161 lb 12.8 oz (73.4 kg)  03/13/17 177 lb 12.8 oz (80.6 kg)   -     Liraglutide -Weight Management (SAXENDA) 18 MG/3ML SOPN; Inject 3 mg into the skin daily. 0.6 mg qd x 1 week, then 1.2 mg qd x 1 week, then 1.8 mg qd x 1 week, then 2.4 mg qd x 1 week, then 3 mg qd  Patient Counseling:   [x]     Nutrition: Stressed importance of moderation in sodium/caffeine intake, saturated fat and cholesterol, caloric balance, sufficient intake of fresh fruits, vegetables, fiber, calcium, iron, and 1 mg of folate supplement per day (for females capable of pregnancy).   [x]      Stressed the importance of regular exercise.    [x]     Substance Abuse: Discussed cessation/primary prevention of tobacco, alcohol, or other drug use; driving or other dangerous activities under the influence; availability of treatment for abuse.    [x]      Injury prevention: Discussed safety belts, safety helmets, smoke detector, smoking near bedding or upholstery.    [x]      Sexuality: Discussed sexually transmitted diseases, partner selection, use of condoms, avoidance of unintended pregnancy  and contraceptive alternatives.    [x]     Dental health: Discussed importance of regular tooth brushing, flossing, and dental visits.   [x]      Health maintenance and immunizations reviewed. Please refer to Health maintenance section.   Briscoe Deutscher, DO [place]

## 2019-07-12 ENCOUNTER — Telehealth: Payer: Self-pay | Admitting: *Deleted

## 2019-07-12 ENCOUNTER — Telehealth: Payer: Self-pay | Admitting: Family Medicine

## 2019-07-12 NOTE — Telephone Encounter (Signed)
See note

## 2019-07-12 NOTE — Telephone Encounter (Signed)
See other message

## 2019-07-12 NOTE — Telephone Encounter (Signed)
Brittany Morrow, pt said Rx for Saxenda was denied and pharmacy sent PA to be done.

## 2019-07-12 NOTE — Telephone Encounter (Signed)
Patient is calling to ask if she will be prescribed any medication due to her current labs results, Patient would like to know the next steps. Call back [phone]

## 2019-07-13 MED ORDER — LEVOTHYROXINE SODIUM 88 MCG PO TABS: 88.0000 ug | ORAL_TABLET | Freq: Every day | ORAL | 3 refills | Status: DC

## 2019-07-13 NOTE — Telephone Encounter (Signed)
auth started  Arta Silence (Key: [facility]) Rx #: [id number] Saxenda 18MG Fayne Mediate pen-injectors   Form [employer] ePA Form Created 7 days ago Sent to Plan 4 minutes ago Plan Response 4 minutes ago Submit Clinical Questions 1 minute ago Determination Wait for Determination Please wait for [employer] to return a determination.

## 2019-07-13 NOTE — Addendum Note (Signed)
Addended by: Briscoe Deutscher R on: 07/13/2019 06:47 AM   Modules accepted: Orders

## 2019-08-19 MED FILL — LEVOTHYROXINE 88 MCG TABLET: 88 | 90 days supply | Qty: 90 | Fill #0

## 2019-08-19 MED FILL — ESOMEPRAZOLE MAG DR 40 MG C: 40 | 30 days supply | Qty: 30 | Fill #0

## 2019-09-28 MED FILL — BLISOVI FE 1/20 1-20 MG-MCG: 1-20 | 84 days supply | Qty: 84 | Fill #1

## 2019-10-06 ENCOUNTER — Ambulatory Visit: Payer: No Typology Code available for payment source | Admitting: Family Medicine

## 2019-10-06 MED FILL — ESOMEPRAZOLE MAG DR 40 MG C: 40 | 30 days supply | Qty: 30 | Fill #1

## 2019-11-03 ENCOUNTER — Encounter: Payer: Self-pay | Admitting: Family Medicine

## 2019-11-03 ENCOUNTER — Other Ambulatory Visit: Payer: Self-pay

## 2019-11-03 ENCOUNTER — Ambulatory Visit (INDEPENDENT_AMBULATORY_CARE_PROVIDER_SITE_OTHER): Payer: No Typology Code available for payment source | Admitting: Family Medicine

## 2019-11-03 VITALS — BP 110/68 | HR 78 | Temp 97.9°F | Resp 16 | Ht 62.0 in | Wt 173.6 lb

## 2019-11-03 DIAGNOSIS — K5900 Constipation, unspecified: Secondary | ICD-10-CM

## 2019-11-03 DIAGNOSIS — E039 Hypothyroidism, unspecified: Secondary | ICD-10-CM

## 2019-11-03 DIAGNOSIS — R140 Abdominal distension (gaseous): Secondary | ICD-10-CM | POA: Diagnosis not present

## 2019-11-03 DIAGNOSIS — E538 Deficiency of other specified B group vitamins: Secondary | ICD-10-CM | POA: Diagnosis not present

## 2019-11-03 DIAGNOSIS — N921 Excessive and frequent menstruation with irregular cycle: Secondary | ICD-10-CM

## 2019-11-03 LAB — CBC WITH DIFFERENTIAL/PLATELET
Basophils Absolute: 0 10*3/uL (ref 0.0–0.1)
Basophils Relative: 0.2 % (ref 0.0–3.0)
Eosinophils Absolute: 0.1 10*3/uL (ref 0.0–0.7)
Eosinophils Relative: 2 % (ref 0.0–5.0)
HCT: 39.6 % (ref 36.0–46.0)
Hemoglobin: 12.9 g/dL (ref 12.0–15.0)
Lymphocytes Relative: 26.8 % (ref 12.0–46.0)
Lymphs Abs: 1.7 10*3/uL (ref 0.7–4.0)
MCHC: 32.6 g/dL (ref 30.0–36.0)
MCV: 87.1 fl (ref 78.0–100.0)
Monocytes Absolute: 0.4 10*3/uL (ref 0.1–1.0)
Monocytes Relative: 6.3 % (ref 3.0–12.0)
Neutro Abs: 4 10*3/uL (ref 1.4–7.7)
Neutrophils Relative %: 64.7 % (ref 43.0–77.0)
Platelets: 233 10*3/uL (ref 150.0–400.0)
RBC: 4.55 Mil/uL (ref 3.87–5.11)
RDW: 13.3 % (ref 11.5–15.5)
WBC: 6.2 10*3/uL (ref 4.0–10.5)

## 2019-11-03 LAB — VITAMIN B12: Vitamin B-12: 216 pg/mL (ref 211–911)

## 2019-11-03 LAB — TSH: TSH: 2.8 u[IU]/mL (ref 0.35–4.50)

## 2019-11-03 NOTE — Patient Instructions (Signed)
I will release your lab results to you on your MyChart account with further instructions. Please reply with any questions.   Please follow up with your gynecologist if your bleeding irregularities persist.  You may have bowel symptoms related to your diet or irritable bowel syndrome or other reasons. I have included information on constipation and IBS below. Stop the Nexium and add Gas X as needed. Try to eat healthy foods as processed foods can aggravate bowel symptoms. Follow up if not improving.    Irritable Bowel Syndrome, Adult  Irritable bowel syndrome (IBS) is a group of symptoms that affects the organs responsible for digestion (gastrointestinal or GI tract). IBS is not one specific disease. To regulate how the GI tract works, the body sends signals back and forth between the intestines and the brain. If you have IBS, there may be a problem with these signals. As a result, the GI tract does not function normally. The intestines may become more sensitive and overreact to certain things. This may be especially true when you eat certain foods or when you are under stress. There are four types of IBS. These may be determined based on the consistency of your stool (feces):  IBS with diarrhea.  IBS with constipation.  Mixed IBS.  Unsubtyped IBS. It is important to know which type of IBS you have. Certain treatments are more likely to be helpful for certain types of IBS. What are the causes? The exact cause of IBS is not known. What increases the risk? You may have a higher risk for IBS if you:  Are female.  Are younger than 40.  Have a family history of IBS.  Have a mental health condition, such as depression, anxiety, or post-traumatic stress disorder.  Have had a bacterial infection of your GI tract. What are the signs or symptoms? Symptoms of IBS vary from person to person. The main symptom is abdominal pain or discomfort. Other symptoms usually include one or more of the  following:  Diarrhea, constipation, or both.  Abdominal swelling or bloating.  Feeling full after eating a small or regular-sized meal.  Frequent gas.  Mucus in the stool.  A feeling of having more stool left after a bowel movement. Symptoms tend to come and go. They may be triggered by stress, mental health conditions, or certain foods. How is this diagnosed? This condition may be diagnosed based on a physical exam, your medical history, and your symptoms. You may have tests, such as:  Blood tests.  Stool test.  X-rays.  CT scan.  Colonoscopy. This is a procedure in which your GI tract is viewed with a long, thin, flexible tube. How is this treated? There is no cure for IBS, but treatment can help relieve symptoms. Treatment depends on the type of IBS you have, and may include:  Changes to your diet, such as: ? Avoiding foods that cause symptoms. ? Drinking more water. ? Following a low-FODMAP (fermentable oligosaccharides, disaccharides, monosaccharides, and polyols) diet for up to 6 weeks, or as told by your health care provider. FODMAPs are sugars that are hard for some people to digest. ? Eating more fiber. ? Eating medium-sized meals at the same times every day.  Medicines. These may include: ? Fiber supplements, if you have constipation. ? Medicine to control diarrhea (antidiarrheal medicines). ? Medicine to help control muscle tightening (spasms) in your GI tract (antispasmodic medicines). ? Medicines to help with mental health conditions, such as antidepressants or tranquilizers.  Talk therapy or  counseling.  Working with a diet and nutrition specialist (dietitian) to help create a food plan that is right for you.  Managing your stress. Follow these instructions at home: Eating and drinking  Eat a healthy diet.  Eat medium-sized meals at about the same time every day. Do not eat large meals.  Gradually eat more fiber-rich foods. These include whole  grains, fruits, and vegetables. This may be especially helpful if you have IBS with constipation.  Eat a diet low in FODMAPs.  Drink enough fluid to keep your urine pale yellow.  Keep a journal of foods that seem to trigger symptoms.  Avoid foods and drinks that: ? Contain added sugar. ? Make your symptoms worse. Dairy products, caffeinated drinks, and carbonated drinks can make symptoms worse for some people. General instructions  Take over-the-counter and prescription medicines and supplements only as told by your health care provider.  Get enough exercise. Do at least 150 minutes of moderate-intensity exercise each week.  Manage your stress. Getting enough sleep and exercise can help you manage stress.  Keep all follow-up visits as told by your health care provider and therapist. This is important. Alcohol Use  Do not drink alcohol if: ? Your health care provider tells you not to drink. ? You are pregnant, may be pregnant, or are planning to become pregnant.  If you drink alcohol, limit how much you have: ? 0-1 drink a day for women. ? 0-2 drinks a day for men.  Be aware of how much alcohol is in your drink. In the [place] one drink equals one typical bottle of beer (12 oz), one-half glass of wine (5 oz), or one shot of hard liquor (1 oz). Contact a health care provider if you have:  Constant pain.  Weight loss.  Difficulty or pain when swallowing.  Diarrhea that gets worse. Get help right away if you have:  Severe abdominal pain.  Fever.  Diarrhea with symptoms of dehydration, such as dizziness or dry mouth.  Bright red blood in your stool.  Stool that is black and tarry.  Abdominal swelling.  Vomiting that does not stop.  Blood in your vomit. Summary  Irritable bowel syndrome (IBS) is not one specific disease. It is a group of symptoms that affects digestion.  Your intestines may become more sensitive and overreact to certain things. This may be  especially true when you eat certain foods or when you are under stress.  There is no cure for IBS, but treatment can help relieve symptoms. This information is not intended to replace advice given to you by your health care provider. Make sure you discuss any questions you have with your health care provider. Document Released: 12/16/2005 Document Revised: 12/09/2017 Document Reviewed: 12/09/2017 Elsevier Patient Education  2020 [employer].  Constipation, Adult Constipation is when a person has fewer bowel movements in a week than normal, has difficulty having a bowel movement, or has stools that are dry, hard, or larger than normal. Constipation may be caused by an underlying condition. It may become worse with age if a person takes certain medicines and does not take in enough fluids. Follow these instructions at home: Eating and drinking   Eat foods that have a lot of fiber, such as fresh fruits and vegetables, whole grains, and beans.  Limit foods that are high in fat, low in fiber, or overly processed, such as french fries, hamburgers, cookies, candies, and soda.  Drink enough fluid to keep your urine clear or pale  yellow. General instructions  Exercise regularly or as told by your health care provider.  Go to the restroom when you have the urge to go. Do not hold it in.  Take over-the-counter and prescription medicines only as told by your health care provider. These include any fiber supplements.  Practice pelvic floor retraining exercises, such as deep breathing while relaxing the lower abdomen and pelvic floor relaxation during bowel movements.  Watch your condition for any changes.  Keep all follow-up visits as told by your health care provider. This is important. Contact a health care provider if:  You have pain that gets worse.  You have a fever.  You do not have a bowel movement after 4 days.  You vomit.  You are not hungry.  You lose weight.  You are  bleeding from the anus.  You have thin, pencil-like stools. Get help right away if:  You have a fever and your symptoms suddenly get worse.  You leak stool or have blood in your stool.  Your abdomen is bloated.  You have severe pain in your abdomen.  You feel dizzy or you faint. This information is not intended to replace advice given to you by your health care provider. Make sure you discuss any questions you have with your health care provider. Document Released: 09/13/2004 Document Revised: 11/28/2017 Document Reviewed: 06/05/2016 Elsevier Patient Education  2020 [employer]

## 2019-11-03 NOTE — Progress Notes (Signed)
Subjective  CC:  Chief Complaint  Patient presents with  . Hypothyroidism    Has been having irregular cycles    HPI: Brittany Morrow is a 33 y.o. female who presents to the office today to address the problems listed above in the chief complaint.  Hypothyroidism f/u: increased dose in July; due for recheck. Feels the same. Some fatigue.   Irregular cycles: on OCPs with mid cycle btb. Has GYN. No pelvic pain  GI: c/o bloating and intermittent weekly constipation alternating with occ loose stools. Diet is not healthy. Denies GERD or heartburn sxs. Had been started on nexium: no change in sxs. No melena, weight changes, localized abdominal/pelvic pain or blood in stool. No FH of IBD. sxs ongoing x 5 years: "started after having my gallbladder out".   F/u vit b12 deficiency; reports had one b12 IM injection.   Ros: no f/c/s, vaginal discharge, sweats, LE edema, change in appetite.    Assessment  1. Acquired hypothyroidism   2. B12 deficiency   3. Bloating   4. Constipation, unspecified constipation type   5. Breakthrough bleeding on birth control pills      Plan   Thyroid f/u:  Recheck levels and adjust dose. Reports good compliance  b12 deficiency: no anemia in July. Recheck today. Will rec oral supplements if needed.   GI sxs: ? Constipation related to diet or IBS or other. See AVS. Start increasing fiber, veggies, gas-x prn and miralax if needed. F/u if not improving. Stop nexium  BTB: check thyroid. If persists, f/u with GYN.   Follow up: f/u for establish care TOC visit.  Visit date not found  Orders Placed This Encounter  Procedures  . TSH  . Vitamin B12  . CBC with Differential/Platelet   No orders of the defined types were placed in this encounter.     I reviewed the patients updated PMH, FH, and SocHx.    Patient Active Problem List   Diagnosis Date Noted  . Obesity (BMI 30.0-34.9) 03/11/2017  . Iliotibial band syndrome of left side 02/18/2017  .  Hypothyroidism 12/27/2015  . Family history of diabetes mellitus 08/06/2013  . History of vitamin D deficiency 08/06/2013  . Dysplasia of cervix, low grade (CIN 1) 07/31/2012   Current Meds  Medication Sig  . cholecalciferol (VITAMIN D) 1000 UNITS tablet Take 1,000 Units by mouth daily.  Marland Kitchen esomeprazole (NEXIUM) 40 MG capsule Take 1 capsule (40 mg total) by mouth daily.  Marland Kitchen levothyroxine (SYNTHROID) 88 MCG tablet Take 1 tablet (88 mcg total) by mouth daily.  . norethindrone-ethinyl estradiol (JUNEL FE,GILDESS FE,LOESTRIN FE) 1-20 MG-MCG tablet Take 1 tablet by mouth daily.  Marland Kitchen triamcinolone ointment (KENALOG) 0.5 % Apply 1 application topically 2 (two) times daily.    Allergies: Patient is allergic to sulfa antibiotics and tramadol. Family History: Patient family history includes Alcohol abuse in her father; Diabetes in her father and paternal grandmother; Hypertension in her mother. Social History:  Patient  reports that she has never smoked. She has never used smokeless tobacco. She reports current alcohol use. She reports that she does not use drugs.  Review of Systems: Constitutional: Negative for fever malaise or anorexia Cardiovascular: negative for chest pain Respiratory: negative for SOB or persistent cough Gastrointestinal: negative for abdominal pain  Objective  Vitals: BP 110/68   Pulse 78   Temp 97.9 F (36.6 C) (Tympanic)   Resp 16   Ht 5\' 2"  (1.575 m)   Wt 173 lb 9.6 oz (  78.7 kg)   LMP 10/20/2019   SpO2 97%   BMI 31.75 kg/m  General: no acute distress , A&Ox3 HEENT: PEERL, conjunctiva normal, Oropharynx moist,neck is supple, no thyromegaly Cardiovascular:  RRR without murmur or gallop. No peripheral edema Respiratory:  Good breath sounds bilaterally, CTAB with normal respiratory effort Gastrointestinal: soft, flat abdomen, normal active bowel sounds, no palpable masses, no hepatosplenomegaly, no appreciated hernias Skin:  Warm, no rashes Neuro: no tremor      Commons side effects, risks, benefits, and alternatives for medications and treatment plan prescribed today were discussed, and the patient expressed understanding of the given instructions. Patient is instructed to call or message via MyChart if he/she has any questions or concerns regarding our treatment plan. No barriers to understanding were identified. We discussed Red Flag symptoms and signs in detail. Patient expressed understanding regarding what to do in case of urgent or emergency type symptoms.   Medication list was reconciled, printed and provided to the patient in AVS. Patient instructions and summary information was reviewed with the patient as documented in the AVS. This note was prepared with assistance of Dragon voice recognition software. Occasional wrong-word or sound-a-like substitutions may have occurred due to the inherent limitations of voice recognition software

## 2019-11-10 ENCOUNTER — Telehealth: Payer: No Typology Code available for payment source | Admitting: Nurse Practitioner

## 2019-11-10 DIAGNOSIS — B3731 Acute candidiasis of vulva and vagina: Secondary | ICD-10-CM

## 2019-11-10 DIAGNOSIS — B373 Candidiasis of vulva and vagina: Secondary | ICD-10-CM | POA: Diagnosis not present

## 2019-11-10 MED ORDER — FLUCONAZOLE 150 MG PO TABS: 150.0000 mg | ORAL_TABLET | Freq: Once | ORAL | 0 refills | Status: AC

## 2019-11-10 MED FILL — FLUCONAZOLE 150 MG TABLET: 150 | 1 days supply | Qty: 1 | Fill #0

## 2019-11-10 NOTE — Progress Notes (Signed)

## 2019-11-17 MED FILL — LEVOTHYROXINE 88 MCG TABLET: 88 | 90 days supply | Qty: 90 | Fill #1

## 2019-12-22 MED FILL — BLISOVI FE 1/20 1-20 MG-MCG: 1-20 | 84 days supply | Qty: 84 | Fill #2

## 2020-02-22 MED FILL — LEVOTHYROXINE 88 MCG TABLET: 88 | 90 days supply | Qty: 90 | Fill #2

## 2020-03-14 MED FILL — BLISOVI FE 1/20 1-20 MG-MCG: 1-20 | 84 days supply | Qty: 84 | Fill #3

## 2020-03-29 ENCOUNTER — Telehealth: Payer: No Typology Code available for payment source | Admitting: Family

## 2020-03-29 DIAGNOSIS — B373 Candidiasis of vulva and vagina: Secondary | ICD-10-CM

## 2020-03-29 DIAGNOSIS — B3731 Acute candidiasis of vulva and vagina: Secondary | ICD-10-CM

## 2020-03-29 MED ORDER — FLUCONAZOLE 150 MG PO TABS: 150.0000 mg | ORAL_TABLET | ORAL | 0 refills | Status: DC | PRN

## 2020-03-29 MED FILL — FLUCONAZOLE 150 MG TABLET: 150 | 6 days supply | Qty: 2 | Fill #0

## 2020-03-29 NOTE — Progress Notes (Signed)

## 2020-04-06 ENCOUNTER — Telehealth: Payer: No Typology Code available for payment source | Admitting: Physician Assistant

## 2020-04-06 DIAGNOSIS — N898 Other specified noninflammatory disorders of vagina: Secondary | ICD-10-CM

## 2020-04-06 NOTE — Progress Notes (Signed)
Hi Michalene,  I am sorry you are not feeling well today.  I am concerned about the description of your symptoms and would feel more comfortable if you were seen by a medical professional for an exam and testing.   Based on what you shared with me, I feel your condition warrants further evaluation and I recommend that you be seen for a face to face office visit. You can make an appointment with your GYN, or see below for our urgent care locations.    NOTE: If you entered your credit card information for this eVisit, you will not be charged. You may see a "hold" on your card for the $35 but that hold will drop off and you will not have a charge processed.   If you are having a true medical emergency please call 911.      For an urgent face to face visit, [place] has five urgent care centers for your convenience:      NEW:  [facility] at [facility] Directions [phone] [address] [place], [place] [address] . 10 am - 6pm Monday - Friday    [facility]) Get Driving Directions [phone] [address] [place], [place] [address] . 10 am to 8 pm Monday-Friday . 12 pm to 8 pm [facility] at [facility] Get Driving Directions [phone] [address], [address] [place], [place] [address] . 8 am to 8 pm Monday-Friday . 9 am to 6 pm Saturday . 11 am to 6 pm Sunday     [facility] at [facility] Get Driving Directions  [phone] [address] [place], [place] [address] . 8 am to 8 pm Monday-Friday . 8 am to 4 pm [facility] Urgent Care at [facility] Directions [phone] [address] [place], [place] [address] . 12 pm to 6 pm Monday-Friday      Your e-visit answers were reviewed by a board certified advanced clinical practitioner to complete your personal care plan.  Thank you  for using e-Visits.

## 2020-04-13 MED FILL — FLUCONAZOLE 150 MG TABS: 150 | 1 days supply | Qty: 1 | Fill #0

## 2020-04-13 MED FILL — CLINDAMYCIN HCL 300 MG CAPS: 300 | 7 days supply | Qty: 14 | Fill #0

## 2020-05-17 ENCOUNTER — Other Ambulatory Visit (HOSPITAL_COMMUNITY): Payer: Self-pay | Admitting: Obstetrics and Gynecology

## 2020-05-17 MED FILL — metroNIDAZOLE 0.75 % GEL: 0.75 | 15 days supply | Qty: 70 | Fill #0

## 2020-05-17 MED FILL — metroNIDAZOLE 500 MG TABS: 500 | 7 days supply | Qty: 14 | Fill #0

## 2020-05-21 ENCOUNTER — Telehealth: Payer: No Typology Code available for payment source | Admitting: Family

## 2020-05-21 DIAGNOSIS — B3731 Acute candidiasis of vulva and vagina: Secondary | ICD-10-CM

## 2020-05-21 DIAGNOSIS — B373 Candidiasis of vulva and vagina: Secondary | ICD-10-CM | POA: Diagnosis not present

## 2020-05-21 MED ORDER — FLUCONAZOLE 150 MG PO TABS: 150.0000 mg | ORAL_TABLET | ORAL | 0 refills | Status: DC | PRN

## 2020-05-21 NOTE — Progress Notes (Signed)
We are sorry that you are not feeling well. Here is how we plan to help! Based on what you shared with me it looks like you: May have a yeast vaginosis  Vaginosis is an inflammation of the vagina that can result in discharge, itching and pain. The cause is usually a change in the normal balance of vaginal bacteria or an infection. Vaginosis can also result from reduced estrogen levels after menopause.  The most common causes of vaginosis are:   Bacterial vaginosis which results from an overgrowth of one on several organisms that are normally present in your vagina.   Yeast infections which are caused by a naturally occurring fungus called candida.   Vaginal atrophy (atrophic vaginosis) which results from the thinning of the vagina from reduced estrogen levels after menopause.   Trichomoniasis which is caused by a parasite and is commonly transmitted by sexual intercourse.  Factors that increase your risk of developing vaginosis include: Marland Kitchen Medications, such as antibiotics and steroids . Uncontrolled diabetes . Use of hygiene products such as bubble bath, vaginal spray or vaginal deodorant . Douching . Wearing damp or tight-fitting clothing . Using an intrauterine device (IUD) for birth control . Hormonal changes, such as those associated with pregnancy, birth control pills or menopause . Sexual activity . Having a sexually transmitted infection  Your treatment plan is A single Diflucan (fluconazole) 150mg  tablet once.  I have electronically sent this prescription into the pharmacy that you have chosen. If your symptoms do not improve you need to be seen face to face.  Be sure to take all of the medication as directed. Stop taking any medication if you develop a rash, tongue swelling or shortness of breath. Mothers who are breast feeding should consider pumping and discarding their breast milk while on these antibiotics. However, there is no consensus that infant exposure at these doses  would be harmful.  Remember that medication creams can weaken latex condoms.   HOME CARE:  Good hygiene may prevent some types of vaginosis from recurring and may relieve some symptoms:  . Avoid baths, hot tubs and whirlpool spas. Rinse soap from your outer genital area after a shower, and dry the area well to prevent irritation. Don't use scented or harsh soaps, such as those with deodorant or antibacterial action. Marland Kitchen Avoid irritants. These include scented tampons and pads. . Wipe from front to back after using the toilet. Doing so avoids spreading fecal bacteria to your vagina.  Other things that may help prevent vaginosis include:  Marland Kitchen Don't douche. Your vagina doesn't require cleansing other than normal bathing. Repetitive douching disrupts the normal organisms that reside in the vagina and can actually increase your risk of vaginal infection. Douching won't clear up a vaginal infection. . Use a latex condom. Both female and female latex condoms may help you avoid infections spread by sexual contact. . Wear cotton underwear. Also wear pantyhose with a cotton crotch. If you feel comfortable without it, skip wearing underwear to bed. Yeast thrives in Marland Kitchen Your symptoms should improve in the next day or two.  GET HELP RIGHT AWAY IF:  . You have pain in your lower abdomen ( pelvic area or over your ovaries) . You develop nausea or vomiting . You develop a fever . Your discharge changes or worsens . You have persistent pain with intercourse . You develop shortness of breath, a rapid pulse, or you faint.  These symptoms could be signs of problems or infections that need  to be evaluated by a medical provider now.  MAKE SURE YOU    Understand these instructions.  Will watch your condition.  Will get help right away if you are not doing well or get worse.  Your e-visit answers were reviewed by a board certified advanced clinical practitioner to complete your personal  care plan. Depending upon the condition, your plan could have included both over the counter or prescription medications. Please review your pharmacy choice to make sure that you have choses a pharmacy that is open for you to pick up any needed prescription, Your safety is important to [place]. If you have drug allergies check your prescription carefully.   You can use MyChart to ask questions about today's visit, request a non-urgent call back, or ask for a work or school excuse for 24 hours related to this e-Visit. If it has been greater than 24 hours you will need to follow up with your provider, or enter a new e-Visit to address those concerns. You will get a MyChart message within the next two days asking about your experience. I hope that your e-visit has been valuable and will speed your recovery.  Approximately 5 minutes was spent documenting and reviewing patient's chart.

## 2020-05-25 MED FILL — LEVOTHYROXINE 88 MCG TABLET: 88 | 60 days supply | Qty: 60 | Fill #3

## 2020-06-30 MED FILL — BLISOVI FE 1/20 1-20 MG-MCG: 1-20 | 28 days supply | Qty: 28 | Fill #0

## 2020-07-06 ENCOUNTER — Other Ambulatory Visit: Payer: Self-pay

## 2020-07-06 ENCOUNTER — Ambulatory Visit (INDEPENDENT_AMBULATORY_CARE_PROVIDER_SITE_OTHER): Payer: No Typology Code available for payment source | Admitting: Family Medicine

## 2020-07-06 ENCOUNTER — Encounter: Payer: Self-pay | Admitting: Family Medicine

## 2020-07-06 VITALS — BP 114/62 | HR 79 | Temp 97.6°F | Resp 18 | Ht 62.0 in | Wt 178.8 lb

## 2020-07-06 DIAGNOSIS — E039 Hypothyroidism, unspecified: Secondary | ICD-10-CM

## 2020-07-06 DIAGNOSIS — Z3041 Encounter for surveillance of contraceptive pills: Secondary | ICD-10-CM | POA: Insufficient documentation

## 2020-07-06 DIAGNOSIS — E538 Deficiency of other specified B group vitamins: Secondary | ICD-10-CM | POA: Diagnosis not present

## 2020-07-06 DIAGNOSIS — K582 Mixed irritable bowel syndrome: Secondary | ICD-10-CM

## 2020-07-06 DIAGNOSIS — Z0000 Encounter for general adult medical examination without abnormal findings: Secondary | ICD-10-CM

## 2020-07-06 HISTORY — DX: Mixed irritable bowel syndrome: K58.2

## 2020-07-06 LAB — COMPREHENSIVE METABOLIC PANEL
ALT: 17 U/L (ref 0–35)
AST: 14 U/L (ref 0–37)
Albumin: 4.4 g/dL (ref 3.5–5.2)
Alkaline Phosphatase: 53 U/L (ref 39–117)
BUN: 10 mg/dL (ref 6–23)
CO2: 28 mEq/L (ref 19–32)
Calcium: 9 mg/dL (ref 8.4–10.5)
Chloride: 103 mEq/L (ref 96–112)
Creatinine, Ser: 0.6 mg/dL (ref 0.40–1.20)
GFR: 114.62 mL/min (ref >60.00)
Glucose, Bld: 93 mg/dL (ref 70–99)
Potassium: 4.2 mEq/L (ref 3.5–5.1)
Sodium: 137 mEq/L (ref 135–145)
Total Bilirubin: 0.6 mg/dL (ref 0.2–1.2)
Total Protein: 7.2 g/dL (ref 6.0–8.3)

## 2020-07-06 LAB — CBC WITH DIFFERENTIAL/PLATELET
Basophils Absolute: 0 10*3/uL (ref 0.0–0.1)
Basophils Relative: 0.4 % (ref 0.0–3.0)
Eosinophils Absolute: 0.1 10*3/uL (ref 0.0–0.7)
Eosinophils Relative: 2 % (ref 0.0–5.0)
HCT: 39.6 % (ref 36.0–46.0)
Hemoglobin: 12.9 g/dL (ref 12.0–15.0)
Lymphocytes Relative: 26.5 % (ref 12.0–46.0)
Lymphs Abs: 1.7 10*3/uL (ref 0.7–4.0)
MCHC: 32.5 g/dL (ref 30.0–36.0)
MCV: 87.8 fl (ref 78.0–100.0)
Monocytes Absolute: 0.4 10*3/uL (ref 0.1–1.0)
Monocytes Relative: 6.3 % (ref 3.0–12.0)
Neutro Abs: 4.3 10*3/uL (ref 1.4–7.7)
Neutrophils Relative %: 64.8 % (ref 43.0–77.0)
Platelets: 231 10*3/uL (ref 150.0–400.0)
RBC: 4.51 Mil/uL (ref 3.87–5.11)
RDW: 14 % (ref 11.5–15.5)
WBC: 6.6 10*3/uL (ref 4.0–10.5)

## 2020-07-06 LAB — LIPID PANEL
Cholesterol: 195 mg/dL (ref 0–200)
HDL: 66.5 mg/dL (ref >39.00)
LDL Cholesterol: 106 mg/dL — ABNORMAL HIGH (ref 0–99)
NonHDL: 128.7
Total CHOL/HDL Ratio: 3
Triglycerides: 115 mg/dL (ref 0.0–149.0)
VLDL: 23 mg/dL (ref 0.0–40.0)

## 2020-07-06 LAB — B12 AND FOLATE PANEL
Folate: >24.8 ng/mL (ref >5.9)
Vitamin B-12: 620 pg/mL (ref 211–911)

## 2020-07-06 LAB — TSH: TSH: 3.71 u[IU]/mL (ref 0.35–4.50)

## 2020-07-06 MED ORDER — VITAMIN B-12 1000 MCG PO TABS: 1000.0000 ug | ORAL_TABLET | Freq: Every day | ORAL | Status: DC

## 2020-07-06 NOTE — Patient Instructions (Signed)
Please return in 12 months for your annual complete physical; please come fasting.  I will release your lab results to you on your MyChart account with further instructions. Please reply with any questions.    If you have any questions or concerns, please don't hesitate to send me a message via MyChart or call the office at [phone]. Thank you for visiting with [place] today! It's our pleasure caring for you.  Please do these things to maintain good health!   Exercise at least 30-45 minutes a day,  4-5 days a week.   Eat a low-fat diet with lots of fruits and vegetables, up to 7-9 servings per day.  Drink plenty of water daily. Try to drink 8 8oz glasses per day.  Seatbelts can save your life. Always wear your seatbelt.  Place Smoke Detectors on every level of your home and check batteries every year.  Schedule an appointment with an eye doctor for an eye exam every 1-2 years  Safe sex - use condoms to protect yourself from STDs if you could be exposed to these types of infections. Use birth control if you do not want to become pregnant and are sexually active.  Avoid heavy alcohol use. If you drink, keep it to less than 2 drinks/day and not every day.  Health Care Power of Attorney.  Choose someone you trust that could speak for you if you became unable to speak for yourself.  Depression is common in our stressful world.If you're feeling down or losing interest in things you normally enjoy, please come in for a visit.  If anyone is threatening or hurting you, please get help. Physical or Emotional Violence is never OK.    Irritable Bowel Syndrome, Adult  Irritable bowel syndrome (IBS) is a group of symptoms that affects the organs responsible for digestion (gastrointestinal or GI tract). IBS is not one specific disease. To regulate how the GI tract works, the body sends signals back and forth between the intestines and the brain. If you have IBS, there may be a problem with these  signals. As a result, the GI tract does not function normally. The intestines may become more sensitive and overreact to certain things. This may be especially true when you eat certain foods or when you are under stress. There are four types of IBS. These may be determined based on the consistency of your stool (feces):  IBS with diarrhea.  IBS with constipation.  Mixed IBS.  Unsubtyped IBS. It is important to know which type of IBS you have. Certain treatments are more likely to be helpful for certain types of IBS. What are the causes? The exact cause of IBS is not known. What increases the risk? You may have a higher risk for IBS if you:  Are female.  Are younger than 45.  Have a family history of IBS.  Have a mental health condition, such as depression, anxiety, or post-traumatic stress disorder.  Have had a bacterial infection of your GI tract. What are the signs or symptoms? Symptoms of IBS vary from person to person. The main symptom is abdominal pain or discomfort. Other symptoms usually include one or more of the following:  Diarrhea, constipation, or both.  Abdominal swelling or bloating.  Feeling full after eating a small or regular-sized meal.  Frequent gas.  Mucus in the stool.  A feeling of having more stool left after a bowel movement. Symptoms tend to come and go. They may be triggered by stress, mental  health conditions, or certain foods. How is this diagnosed? This condition may be diagnosed based on a physical exam, your medical history, and your symptoms. You may have tests, such as:  Blood tests.  Stool test.  X-rays.  CT scan.  Colonoscopy. This is a procedure in which your GI tract is viewed with a long, thin, flexible tube. How is this treated? There is no cure for IBS, but treatment can help relieve symptoms. Treatment depends on the type of IBS you have, and may include:  Changes to your diet, such as: ? Avoiding foods that cause  symptoms. ? Drinking more water. ? Following a low-FODMAP (fermentable oligosaccharides, disaccharides, monosaccharides, and polyols) diet for up to 6 weeks, or as told by your health care provider. FODMAPs are sugars that are hard for some people to digest. ? Eating more fiber. ? Eating medium-sized meals at the same times every day.  Medicines. These may include: ? Fiber supplements, if you have constipation. ? Medicine to control diarrhea (antidiarrheal medicines). ? Medicine to help control muscle tightening (spasms) in your GI tract (antispasmodic medicines). ? Medicines to help with mental health conditions, such as antidepressants or tranquilizers.  Talk therapy or counseling.  Working with a diet and nutrition specialist (dietitian) to help create a food plan that is right for you.  Managing your stress. Follow these instructions at home: Eating and drinking  Eat a healthy diet.  Eat medium-sized meals at about the same time every day. Do not eat large meals.  Gradually eat more fiber-rich foods. These include whole grains, fruits, and vegetables. This may be especially helpful if you have IBS with constipation.  Eat a diet low in FODMAPs.  Drink enough fluid to keep your urine pale yellow.  Keep a journal of foods that seem to trigger symptoms.  Avoid foods and drinks that: ? Contain added sugar. ? Make your symptoms worse. Dairy products, caffeinated drinks, and carbonated drinks can make symptoms worse for some people. General instructions  Take over-the-counter and prescription medicines and supplements only as told by your health care provider.  Get enough exercise. Do at least 150 minutes of moderate-intensity exercise each week.  Manage your stress. Getting enough sleep and exercise can help you manage stress.  Keep all follow-up visits as told by your health care provider and therapist. This is important. Alcohol Use  Do not drink alcohol if: ? Your  health care provider tells you not to drink. ? You are pregnant, may be pregnant, or are planning to become pregnant.  If you drink alcohol, limit how much you have: ? 0-1 drink a day for women. ? 0-2 drinks a day for men.  Be aware of how much alcohol is in your drink. In the [place] one drink equals one typical bottle of beer (12 oz), one-half glass of wine (5 oz), or one shot of hard liquor (1 oz). Contact a health care provider if you have:  Constant pain.  Weight loss.  Difficulty or pain when swallowing.  Diarrhea that gets worse. Get help right away if you have:  Severe abdominal pain.  Fever.  Diarrhea with symptoms of dehydration, such as dizziness or dry mouth.  Bright red blood in your stool.  Stool that is black and tarry.  Abdominal swelling.  Vomiting that does not stop.  Blood in your vomit. Summary  Irritable bowel syndrome (IBS) is not one specific disease. It is a group of symptoms that affects digestion.  Your intestines may  become more sensitive and overreact to certain things. This may be especially true when you eat certain foods or when you are under stress.  There is no cure for IBS, but treatment can help relieve symptoms. This information is not intended to replace advice given to you by your health care provider. Make sure you discuss any questions you have with your health care provider. Document Revised: 12/09/2017 Document Reviewed: 12/09/2017 Elsevier Patient Education  2020 [employer].

## 2020-07-06 NOTE — Progress Notes (Signed)
Subjective  Chief Complaint  Patient presents with   Transitions Of Care    Fasting labs.    Annual Exam    HPI: Brittany Morrow is a 34 y.o. female who presents to [facility] [place] today for a Female Wellness Visit.  She also has the concerns and/or needs as listed above in the chief complaint. These will be addressed in addition to the Health Maintenance Visit.   Wellness Visit: annual visit with health maintenance review and exam without Pap   HM: to see GYN for female wellness. Remote h/o CIN1/VAIN1 and STDs. monagomous relationship. No concerns for STD. Feels well. On OCPs with some BTB midcycle (tolerable). 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   Chronic disease management visit and/or acute problem visit:  b12 deficiency: on supplements for several months now. For recheck.   Low thyroid: compliant with meds. No sxs of low or high thyroid.   IBS: has bloating and intermittent/alternating diarrhea/constipation. Has learned her food triggers. No pain. No bleeding. No weight changes. Prefers to NOT take RX meds unless needed.   Assessment  1. Annual physical exam   2. Vitamin B12 deficiency   3. Acquired hypothyroidism   4. Irritable bowel syndrome with both constipation and diarrhea   5. Oral contraceptive use      Plan  Female Wellness Visit:  Age appropriate Health Maintenance and Prevention measures were discussed with patient. Included topics are cancer screening recommendations, ways to keep healthy (see AVS) including dietary and exercise recommendations, regular eye and dental care, use of seat belts, and avoidance of moderate alcohol use and tobacco use. Screens up to date. To see GYN soon; requesting she have records sent here.  BMI: discussed patient's BMI and encouraged positive lifestyle modifications to help get to or maintain a target BMI.  HM needs and immunizations were addressed and ordered. See below for orders. See HM  and immunization section for updates. utd  Routine labs and screening tests ordered including cmp, cbc and lipids where appropriate.  Discussed recommendations regarding Vit D and calcium supplementation (see AVS)  Chronic disease f/u and/or acute problem visit: (deemed necessary to be done in addition to the wellness visit):  Hypothyroidism: Clinically euthyroid.  Will recheck today to make sure she is stable  With a B12 deficiency now on oral supplementation.  Recheck levels today.  IBS: New diagnosis.  Symptoms are consistent with clinical syndrome.  Education given.  Start fiber supplement.  Avoid triggers.  If worsening, return for recheck and consider antispasmodics.  See handout for information  History of CIN-1/VAIN-1, remote: Request GYN records.  Suspect she is back to routine screening.  She defers STD screenings at this time.  Oral contraceptive use: Still with mild breakthrough bleeding.  She is on a low estrogen pill.  Recommend discussing with GYN and possibly changing her to a higher strength OCP.  Follow up: 1 year for complete physical  Orders Placed This Encounter  Procedures   B12 and Folate Panel   CBC with Differential/Platelet   Comprehensive metabolic panel   Lipid panel   TSH   Meds ordered this encounter  Medications   vitamin B-12 (CYANOCOBALAMIN) 1000 MCG tablet    Sig: Take 1 tablet (1,000 mcg total) by mouth daily.      Lifestyle: Body mass index is 32.7 kg/m. Wt Readings from Last 3 Encounters:  07/06/20 178 lb 12.8 oz (81.1 kg)  11/03/19 173 lb 9.6  oz (78.7 kg)  07/06/19 167 lb 6.4 oz (75.9 kg)    Patient Active Problem List   Diagnosis Date Noted   Vitamin B12 deficiency 07/06/2020   Irritable bowel syndrome with both constipation and diarrhea 07/06/2020   Oral contraceptive use 07/06/2020   Obesity (BMI 30.0-34.9) 03/11/2017   Acquired hypothyroidism 12/27/2015   History of cervical dysplasia 07/31/2012    VAIN I and  CIN 1 cured with Laser vaporization of cervix and vagina 2006    Health Maintenance  Topic Date Due   INFLUENZA VACCINE  07/30/2020   PAP SMEAR-Modifier  04/29/2022   TETANUS/TDAP  09/08/2023   COVID-19 Vaccine  Completed   Hepatitis C Screening  Completed   HIV Screening  Completed   Immunization History  Administered Date(s) Administered   HPV Quadrivalent 09/16/2005, 11/20/2005, 05/02/2006   Influenza-Unspecified 09/13/2014, 09/19/2015   PFIZER SARS-COV-2 Vaccination 01/18/2020, 02/07/2020   PPD Test 01/11/2012, 07/08/2014   Tdap 09/07/2013   We updated and reviewed the patient's past history in detail and it is documented below. Allergies: Patient  reports current alcohol use. Past Medical History Patient  has a past medical history of Chlamydia (2002, 2005), Gonorrhea (2014), History of cervical dysplasia (07/31/2012), Hypothyroidism, Irritable bowel syndrome with both constipation and diarrhea (07/06/2020), and Vitamin D deficiency. Past Surgical History Patient  has a past surgical history that includes Cholecystectomy; LASER VAPORIZATION OF CERVIX AND VAGINA (2008); and Reduction mammaplasty. Social History   Socioeconomic History   Marital status: Single    Spouse name: Not on file   Number of children: 1   Years of education: College   Highest education level: Not on file  Occupational History   Occupation: Teacher, 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[employer]    Comment: Neurol[employer]  Tobacco Use   Smoking status: Never Smoker   Smokeless tobacco: Never Used  Substance and Sexual Activity   Alcohol use: Yes    Comment: RARE-SOCIALLY ONLY   Drug use: No   Sexual activity: Yes    Birth control/protection: Pill  Other Topics Concern   Not on file  Social History Narrative   Not on file   Social Determinants of Health   Financial Resource Strain:    Difficulty of Paying Living Expenses:   Food Insecurity:    Worried About Programme researcher, broadcasting/film/video in the Last Year:     Barista in the Last Year:   Transportation Needs:    Freight forwarder (Medical):    Lack of Transportation (Non-Medical):   Physical Activity:    Days of Exercise per Week:    Minutes of Exercise per Session:   Stress:    Feeling of Stress :   Social Connections:    Frequency of Communication with Friends and Family:    Frequency of Social Gatherings with Friends and Family:    Attends Religious Services:    Active Member of Clubs or Organizations:    Attends Engineer, structural:    Marital Status:    Family History  Problem Relation Age of Onset   Diabetes Father    Alcohol abuse Father    Diabetes Paternal Grandmother    Hypertension Mother    Healthy Daughter     Review of Systems: Constitutional: negative for fever or malaise Ophthalmic: negative for photophobia, double vision or loss of vision Cardiovascular: negative for chest pain, dyspnea on exertion, or new LE swelling Respiratory: negative for SOB or persistent cough Gastrointestinal: negative for abdominal  pain, + for change in bowel habits or melena Genitourinary: negative for dysuria or gross hematuria, no abnormal uterine bleeding or disharge Musculoskeletal: negative for new gait disturbance or muscular weakness Integumentary: negative for new or persistent rashes, no breast lumps Neurological: negative for TIA or stroke symptoms Psychiatric: negative for SI or delusions Allergic/Immunologic: negative for hives  Patient Care Team    Relationship Specialty Notifications Start End  Willow Ora, MD PCP - General Family Medicine  11/03/19   Lynden Ang, NP Nurse Practitioner Obstetrics and Gynecology  02/18/17     Objective  Vitals: BP 114/62    Pulse 79    Temp 97.6 F (36.4 C) (Temporal)    Resp 18    Ht 5\' 2"  (1.575 m)    Wt 178 lb 12.8 oz (81.1 kg)    LMP 06/22/2020 (Exact Date)    SpO2 98%    BMI 32.70 kg/m  General:  Well developed, well nourished, no  acute distress  Psych:  Alert and orientedx3,normal mood and affect HEENT:  Normocephalic, atraumatic, non-icteric sclera, PERRL, supple neck without adenopathy, mass or thyromegaly Cardiovascular:  Normal S1, S2, RRR without gallop, rub or murmur Respiratory:  Good breath sounds bilaterally, CTAB with normal respiratory effort Gastrointestinal: normal bowel sounds, soft, non-tender, no noted masses. No HSM MSK: no deformities, contusions. Joints are without erythema or swelling.  Skin:  Warm, no rashes or suspicious lesions noted Neurologic:    Mental status is normal. Gross motor and sensory exams are normal. Normal gait. No tremor Breast Exam: No mass, skin retraction or nipple discharge is appreciated in either breast. No axillary adenopathy. Fibrocystic changes are not noted    Commons side effects, risks, benefits, and alternatives for medications and treatment plan prescribed today were discussed, and the patient expressed understanding of the given instructions. Patient is instructed to call or message via MyChart if he/she has any questions or concerns regarding our treatment plan. No barriers to understanding were identified. We discussed Red Flag symptoms and signs in detail. Patient expressed understanding regarding what to do in case of urgent or emergency type symptoms.   Medication list was reconciled, printed and provided to the patient in AVS. Patient instructions and summary information was reviewed with the patient as documented in the AVS. This note was prepared with assistance of Dragon voice recognition software. Occasional wrong-word or sound-a-like substitutions may have occurred due to the inherent limitations of voice recognition software  This visit occurred during the SARS-CoV-2 public health emergency.  Safety protocols were in place, including screening questions prior to the visit, additional usage of staff PPE, and extensive cleaning of exam room while observing  appropriate contact time as indicated for disinfecting solutions.

## 2020-07-18 ENCOUNTER — Other Ambulatory Visit (HOSPITAL_COMMUNITY): Payer: Self-pay | Admitting: Obstetrics and Gynecology

## 2020-07-22 MED FILL — BLISOVI FE 1/20 1-20 MG-MCG: 1-20 | 84 days supply | Qty: 84 | Fill #0

## 2020-08-01 ENCOUNTER — Other Ambulatory Visit: Payer: Self-pay

## 2020-08-01 ENCOUNTER — Encounter: Payer: Self-pay | Admitting: Physician Assistant

## 2020-08-01 ENCOUNTER — Ambulatory Visit (INDEPENDENT_AMBULATORY_CARE_PROVIDER_SITE_OTHER)
Admission: RE | Admit: 2020-08-01 | Discharge: 2020-08-01 | Disposition: A | Discharge status: Home / Self Care | Payer: No Typology Code available for payment source | Source: Ambulatory Visit | Attending: Physician Assistant | Admitting: Physician Assistant

## 2020-08-01 ENCOUNTER — Ambulatory Visit (INDEPENDENT_AMBULATORY_CARE_PROVIDER_SITE_OTHER): Payer: No Typology Code available for payment source | Admitting: Physician Assistant

## 2020-08-01 VITALS — BP 122/84 | HR 73 | Temp 97.7°F | Ht 62.0 in | Wt 181.8 lb

## 2020-08-01 DIAGNOSIS — L089 Local infection of the skin and subcutaneous tissue, unspecified: Secondary | ICD-10-CM

## 2020-08-01 MED ORDER — DOXYCYCLINE HYCLATE 100 MG PO TABS
100.0000 mg | ORAL_TABLET | Freq: Two times a day (BID) | ORAL | 0 refills | Status: DC
Start: 2020-08-01 — End: 2021-01-31

## 2020-08-01 MED ORDER — FLUCONAZOLE 150 MG PO TABS
150.0000 mg | ORAL_TABLET | Freq: Once | ORAL | 0 refills | Status: AC
Start: 2020-08-01 — End: 2020-08-01

## 2020-08-01 NOTE — Progress Notes (Signed)
Brittany Morrow is a 34 y.o. female here for eczema flare up.  I acted as a Neurosurgeon for [employer], PA-C [employer], [place]  History of Present Illness:   Chief Complaint  Patient presents with   Eczema    HPI  Eczema  Pt c/o of eczema flare up of her left hand between thumb and index finger. She was using triamcinolone, but then she went and got her nails done. Had severe pain when they dipped her fingers in acetone. Since that time she has had severe swelling and pain, with slight discharge. Symptoms worsening with time.  Pt uses triamcinolone ointment, which did not help with symptoms.  Denies: fevers, chills, malaise, purulent discharge   Past Medical History:  Diagnosis Date   Chlamydia 2002, 2005   Gonorrhea 2014   History of cervical dysplasia 07/31/2012   VAIN I and CIN 1 cured with Laser vaporization of cervix and vagina 2006   Hypothyroidism    Irritable bowel syndrome with both constipation and diarrhea 07/06/2020   Vitamin D deficiency      Social History   Tobacco Use   Smoking status: Never Smoker   Smokeless tobacco: Never Used  Substance Use Topics   Alcohol use: Yes    Comment: RARE-SOCIALLY ONLY   Drug use: No    Past Surgical History:  Procedure Laterality Date   CHOLECYSTECTOMY     LASER VAPORIZATION OF CERVIX AND VAGINA  2008   [facility]--DR FERNANDEZ   REDUCTION MAMMAPLASTY      Family History  Problem Relation Age of Onset   Diabetes Father    Alcohol abuse Father    Diabetes Paternal Grandmother    Hypertension Mother    Healthy Daughter     Allergies  Allergen Reactions   Sulfa Antibiotics Palpitations   Tramadol Itching    Current Medications:   Current Outpatient Medications:    cholecalciferol (VITAMIN D) 1000 UNITS tablet, Take 1,000 Units by mouth daily., Disp: , Rfl:    levothyroxine (SYNTHROID) 88 MCG tablet, Take 1 tablet (88 mcg total) by mouth daily., Disp: 90 tablet, Rfl: 3    norethindrone-ethinyl estradiol (JUNEL FE,GILDESS FE,LOESTRIN FE) 1-20 MG-MCG tablet, Take 1 tablet by mouth daily., Disp: 4 Package, Rfl: 3   triamcinolone ointment (KENALOG) 0.5 %, Apply 1 application topically 2 (two) times daily. (Patient taking differently: Apply 1 application topically as needed. ), Disp: 15 g, Rfl: 4   vitamin B-12 (CYANOCOBALAMIN) 1000 MCG tablet, Take 1 tablet (1,000 mcg total) by mouth daily., Disp: , Rfl:    doxycycline (VIBRA-TABS) 100 MG tablet, Take 1 tablet (100 mg total) by mouth 2 (two) times daily., Disp: 20 tablet, Rfl: 0   fluconazole (DIFLUCAN) 150 MG tablet, Take 1 tablet (150 mg total) by mouth once for 1 dose., Disp: 1 tablet, Rfl: 0   Review of Systems:   ROS  Negative unless otherwise specified per HPI.   Vitals:   Vitals:   08/01/20 0826  BP: 122/84  Pulse: 73  Temp: 97.7 F (36.5 C)  TempSrc: Temporal  SpO2: 99%  Weight: 181 lb 12.8 oz (82.5 kg)  Height: 5\' 2"  (1.575 m)     Body mass index is 33.25 kg/m.  Physical Exam:   Physical Exam Constitutional:      Appearance: She is well-developed.  HENT:     Head: Normocephalic and atraumatic.  Eyes:     Conjunctiva/sclera: Conjunctivae normal.  Pulmonary:     Effort: Pulmonary effort is normal.  Musculoskeletal:        General: Normal range of motion.     Cervical back: Normal range of motion and neck supple.  Skin:    General: Skin is warm and dry.     Comments: L index finger with swelling and decreased ROM of DIP and PIP; erythematous lesions to lateral edge of finger  L thumb with slight erythema and raised lesions to lateral edge of finger  Neurological:     Mental Status: She is alert and oriented to person, place, and time.  Psychiatric:        Behavior: Behavior normal.        Thought Content: Thought content normal.        Judgment: Judgment normal.     Assessment and Plan:   Dimples was seen today for eczema.  Diagnoses and all orders for this  visit:  Finger infection -     DG Finger Index Left  Other orders -     doxycycline (VIBRA-TABS) 100 MG tablet; Take 1 tablet (100 mg total) by mouth 2 (two) times daily. -     fluconazole (DIFLUCAN) 150 MG tablet; Take 1 tablet (150 mg total) by mouth once for 1 dose.   Area appears infected. Due to limited ROM and worsening symptoms, will obtain finger xray for further evaluation. Start oral doxycycline. She has requested diflucan for yeast infection in case she gets this with the oral antibiotic. Follow-up in 1-2 days if not better.   Reviewed expectations re: course of current medical issues.  Discussed self-management of symptoms.  Outlined signs and symptoms indicating need for more acute intervention.  Patient verbalized understanding and all questions were answered.  See orders for this visit as documented in the electronic medical record.  Patient received an After-Visit Summary.  CMA or LPN served as scribe during this visit. History, Physical, and Plan performed by medical provider. The above documentation has been reviewed and is accurate and complete.  Jarold Motto, PA-C

## 2020-08-01 NOTE — Patient Instructions (Addendum)
It was great to see you!  Vaseline to keep moist Start antibiotic doxycycline Diflucan has been sent in  Will put in a referral for dermatology -- someone will be in touch soon regarding this.  An order for an xray has been put in for you. To get your xray, you can walk in at the [facility] location without a scheduled appointment. The address is [address] It is across the street from [facility]. X-ray is located in the basement.  Hours of operation are M-F 8:30am to 5:00pm. Please note that they are closed for lunch between 12:30 and 1:00pm.   Take care,  Jarold Motto PA-C

## 2020-08-03 ENCOUNTER — Other Ambulatory Visit: Payer: Self-pay | Admitting: Family Medicine

## 2020-08-03 ENCOUNTER — Telehealth: Payer: Self-pay | Admitting: Family Medicine

## 2020-08-03 DIAGNOSIS — E039 Hypothyroidism, unspecified: Secondary | ICD-10-CM

## 2020-08-03 MED ORDER — LEVOTHYROXINE SODIUM 88 MCG PO TABS: 88.0000 ug | ORAL_TABLET | Freq: Every day | ORAL | 3 refills | Status: DC

## 2020-08-03 MED FILL — BLISOVI FE 1/20 1-20 MG-MCG: 1-20 | 84 days supply | Qty: 84 | Fill #0

## 2020-08-03 MED FILL — LEVOTHYROXINE 88 MCG TABLET: 88 | 90 days supply | Qty: 90 | Fill #0

## 2020-08-03 NOTE — Telephone Encounter (Signed)
Patient's medication refill has been sent to [facility].

## 2020-08-03 NOTE — Telephone Encounter (Signed)
  LAST APPOINTMENT DATE: 08/01/2020   NEXT APPOINTMENT DATE:@Visit  date not found  MEDICATION: levothyroxine (SYNTHROID) 88 MCG tablet  PHARMACY:[facility] - [place], [place] - [address]

## 2020-10-25 MED FILL — BLISOVI FE 1/20 1-20 MG-MCG: 1-20 | 84 days supply | Qty: 84 | Fill #1

## 2020-11-09 MED FILL — LEVOTHYROXINE 88 MCG TABLET: 88 | 90 days supply | Qty: 90 | Fill #1

## 2020-11-30 ENCOUNTER — Other Ambulatory Visit (HOSPITAL_COMMUNITY): Payer: Self-pay | Admitting: Obstetrics and Gynecology

## 2020-11-30 MED FILL — CLINDAMYCIN HCL 300 MG CAPS: 300 | 7 days supply | Qty: 14 | Fill #0

## 2020-12-05 ENCOUNTER — Other Ambulatory Visit (HOSPITAL_COMMUNITY): Payer: Self-pay | Admitting: Obstetrics and Gynecology

## 2020-12-05 MED FILL — FLUCONAZOLE 150 MG TABS: 150 | 1 days supply | Qty: 1 | Fill #0

## 2020-12-20 ENCOUNTER — Other Ambulatory Visit: Payer: Self-pay | Admitting: Family

## 2020-12-20 ENCOUNTER — Telehealth: Payer: No Typology Code available for payment source | Admitting: Family

## 2020-12-20 DIAGNOSIS — B3731 Acute candidiasis of vulva and vagina: Secondary | ICD-10-CM

## 2020-12-20 DIAGNOSIS — B373 Candidiasis of vulva and vagina: Secondary | ICD-10-CM

## 2020-12-20 MED ORDER — FLUCONAZOLE 150 MG PO TABS
150.0000 mg | ORAL_TABLET | ORAL | 0 refills | Status: DC | PRN
Start: 2020-12-20 — End: 2021-01-31

## 2020-12-20 MED FILL — FLUCONAZOLE 150 MG TABS: 150 | 6 days supply | Qty: 2 | Fill #0

## 2020-12-20 NOTE — Progress Notes (Signed)

## 2020-12-21 ENCOUNTER — Other Ambulatory Visit (HOSPITAL_COMMUNITY): Payer: Self-pay | Admitting: Obstetrics and Gynecology

## 2020-12-21 MED FILL — CLINDAMYCIN HCL 300 MG CAPS: 300 | 7 days supply | Qty: 14 | Fill #0

## 2021-01-17 MED FILL — BLISOVI FE 1/20 1-20 MG-MCG: 1-20 | 84 days supply | Qty: 84 | Fill #2

## 2021-01-31 ENCOUNTER — Other Ambulatory Visit: Payer: Self-pay | Admitting: Family Medicine

## 2021-01-31 ENCOUNTER — Telehealth (INDEPENDENT_AMBULATORY_CARE_PROVIDER_SITE_OTHER): Payer: No Typology Code available for payment source | Admitting: Family Medicine

## 2021-01-31 ENCOUNTER — Encounter: Payer: Self-pay | Admitting: Family Medicine

## 2021-01-31 ENCOUNTER — Other Ambulatory Visit: Payer: Self-pay

## 2021-01-31 DIAGNOSIS — J0100 Acute maxillary sinusitis, unspecified: Secondary | ICD-10-CM | POA: Diagnosis not present

## 2021-01-31 DIAGNOSIS — H9202 Otalgia, left ear: Secondary | ICD-10-CM | POA: Diagnosis not present

## 2021-01-31 MED ORDER — FLUCONAZOLE 150 MG PO TABS: 150.0000 mg | ORAL_TABLET | ORAL | 0 refills | Status: DC | PRN

## 2021-01-31 MED ORDER — AMOXICILLIN-POT CLAVULANATE 875-125 MG PO TABS: 1.0000 | ORAL_TABLET | Freq: Two times a day (BID) | ORAL | 0 refills | Status: DC

## 2021-01-31 MED FILL — FLUCONAZOLE 150 MG TABS: 150 | 6 days supply | Qty: 2 | Fill #0

## 2021-01-31 MED FILL — AMOX TR-K CLV 875-125 MG TA: 875-125 | 7 days supply | Qty: 14 | Fill #0

## 2021-01-31 NOTE — Progress Notes (Signed)
Virtual Visit via Video Note  Subjective  CC:  Chief Complaint  Patient presents with  . Ear Pain    Left ear  . Nasal Congestion    Causing sore throat and facial pressure     I connected with Brittany Morrow on 01/31/21 at  4:00 PM EST by a video enabled telemedicine application and verified that I am speaking with the correct person using two identifiers. Location patient: Home Location provider: [facility] [address], Office Persons participating in the virtual visit: Brittany Morrow, Willow Ora, MD Adela Glimpse CMA  I discussed the limitations of evaluation and management by telemedicine and the availability of in person appointments. The patient expressed understanding and agreed to proceed. HPI: Brittany Morrow is a 35 y.o. female who was contacted today to address the problems listed above in the chief complaint. . 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  Describes deep sharp pain made worse with swallowing.  Has some congestion.  Denies fevers, chills, loss of taste or smell, cough, malaise or myalgias.  She has been fully vaccinated.  Has not yet had the booster.  Has had no known exposures.  Most recent Covid test was 2 weeks ago and it was negative.  She does not have tenderness on her ear or pain with moving the ear.  She has had no history of ear infections.  She reports her teeth hurt a little bit.  No major history of sinus infections.  Appetite is normal.  No GI symptoms.   Assessment  1. Acute non-recurrent maxillary sinusitis   2. Acute otalgia, left      Plan   Left ear pain and sinus pain, possible acute sinusitis: Discussed differential diagnosis.  Difficult with video visit due to limited exam.  Recommend Covid testing.  Advil, Mucinex and Augmentin.  Follow-up in 48 hours if not improving.  Recommend notifying employee health. I discussed the assessment and treatment plan  with the patient. The patient was provided an opportunity to ask questions and all were answered. The patient agreed with the plan and demonstrated an understanding of the instructions.   The patient was advised to call back or seek an in-person evaluation if the symptoms worsen or if the condition fails to improve as anticipated. Follow up: As scheduled for complete physical 07/09/2021  Meds ordered this encounter  Medications  . fluconazole (DIFLUCAN) 150 MG tablet    Sig: Take 1 tablet (150 mg total) by mouth every three (3) days as needed.    Dispense:  2 tablet    Refill:  0  . amoxicillin-clavulanate (AUGMENTIN) 875-125 MG tablet    Sig: Take 1 tablet by mouth 2 (two) times daily.    Dispense:  14 tablet    Refill:  0      I reviewed the patients updated PMH, FH, and SocHx.    Patient Active Problem List   Diagnosis Date Noted  . Vitamin B12 deficiency 07/06/2020  . Irritable bowel syndrome with both constipation and diarrhea 07/06/2020  . Oral contraceptive use 07/06/2020  . Obesity (BMI 30.0-34.9) 03/11/2017  . Acquired hypothyroidism 12/27/2015  . History of cervical dysplasia 07/31/2012   Current Meds  Medication Sig  . amoxicillin-clavulanate (AUGMENTIN) 875-125 MG tablet Take 1 tablet by mouth 2 (two) times daily.  . cholecalciferol (VITAMIN D) 1000 UNITS tablet Take 1,000 Units by mouth daily.  Marland Kitchen  fluconazole (DIFLUCAN) 150 MG tablet Take 1 tablet (150 mg total) by mouth every three (3) days as needed.  Marland Kitchen levothyroxine (SYNTHROID) 88 MCG tablet Take 1 tablet (88 mcg total) by mouth daily.  . norethindrone-ethinyl estradiol (JUNEL FE,GILDESS FE,LOESTRIN FE) 1-20 MG-MCG tablet Take 1 tablet by mouth daily.  Marland Kitchen triamcinolone ointment (KENALOG) 0.5 % Apply 1 application topically 2 (two) times daily. (Patient taking differently: Apply 1 application topically as needed.)  . vitamin B-12 (CYANOCOBALAMIN) 1000 MCG tablet Take 1 tablet (1,000 mcg total) by mouth daily.  .  [DISCONTINUED] doxycycline (VIBRA-TABS) 100 MG tablet Take 1 tablet (100 mg total) by mouth 2 (two) times daily.  . [DISCONTINUED] fluconazole (DIFLUCAN) 150 MG tablet Take 1 tablet (150 mg total) by mouth every three (3) days as needed.    Allergies: Patient is allergic to sulfa antibiotics and tramadol. Family History: Patient family history includes Alcohol abuse in her father; Diabetes in her father and paternal grandmother; Healthy in her daughter; Hypertension in her mother. Social History:  Patient  reports that she has never smoked. She has never used smokeless tobacco. She reports current alcohol use. She reports that she does not use drugs.  Review of Systems: Constitutional: Negative for fever malaise or anorexia Cardiovascular: negative for chest pain Respiratory: negative for SOB or persistent cough Gastrointestinal: negative for abdominal pain  OBJECTIVE Vitals: There were no vitals taken for this visit. General: no acute distress , A&Ox3 Points over left maxillary sinus to describe pain, no pain with tugging at the ear pinna on left  Willow Ora, MD

## 2021-02-16 MED FILL — LEVOTHYROXINE 88 MCG TABLET: 88 | 90 days supply | Qty: 90 | Fill #2

## 2021-02-16 MED FILL — metroNIDAZOLE 0.75 % GEL: 0.75 | 15 days supply | Qty: 70 | Fill #0

## 2021-03-05 ENCOUNTER — Telehealth: Payer: Self-pay

## 2021-03-05 NOTE — Telephone Encounter (Signed)
Per the AVS for 08/01/20 , pt was  Supposed to have a referral put in for a dermatologist.  "Will put in a referral for dermatology -- someone will be in touch soon regarding this."   Pt is requesting that referral be sent in.

## 2021-03-07 ENCOUNTER — Other Ambulatory Visit: Payer: Self-pay

## 2021-03-07 DIAGNOSIS — L309 Dermatitis, unspecified: Secondary | ICD-10-CM

## 2021-03-07 NOTE — Telephone Encounter (Signed)
Patient was seen by [facility] for that visit, but I have placed referral for derm

## 2021-04-11 ENCOUNTER — Other Ambulatory Visit (HOSPITAL_COMMUNITY): Payer: Self-pay

## 2021-04-11 MED FILL — Metronidazole Vaginal Gel 0.75%: VAGINAL | 14 days supply | Qty: 70 | Fill #0 | Status: AC

## 2021-04-11 MED FILL — Norethindrone Ace & Ethinyl Estradiol-FE Tab 1 MG-20 MCG: ORAL | 28 days supply | Qty: 28 | Fill #0 | Status: AC

## 2021-04-12 ENCOUNTER — Other Ambulatory Visit (HOSPITAL_COMMUNITY): Payer: Self-pay

## 2021-04-13 ENCOUNTER — Other Ambulatory Visit (HOSPITAL_COMMUNITY): Payer: Self-pay

## 2021-04-27 ENCOUNTER — Other Ambulatory Visit: Payer: Self-pay

## 2021-04-27 ENCOUNTER — Other Ambulatory Visit (HOSPITAL_COMMUNITY): Payer: Self-pay

## 2021-04-27 ENCOUNTER — Ambulatory Visit: Payer: No Typology Code available for payment source | Admitting: Allergy

## 2021-04-27 ENCOUNTER — Other Ambulatory Visit: Payer: Self-pay | Admitting: Allergy

## 2021-04-27 ENCOUNTER — Encounter: Payer: Self-pay | Admitting: Allergy

## 2021-04-27 VITALS — BP 110/70 | HR 92 | Temp 98.2°F | Resp 18 | Ht 63.5 in | Wt 184.6 lb

## 2021-04-27 DIAGNOSIS — J3089 Other allergic rhinitis: Secondary | ICD-10-CM

## 2021-04-27 DIAGNOSIS — L301 Dyshidrosis [pompholyx]: Secondary | ICD-10-CM | POA: Diagnosis not present

## 2021-04-27 DIAGNOSIS — T781XXA Other adverse food reactions, not elsewhere classified, initial encounter: Secondary | ICD-10-CM

## 2021-04-27 DIAGNOSIS — T7819XA Other adverse food reactions, not elsewhere classified, initial encounter: Secondary | ICD-10-CM

## 2021-04-27 DIAGNOSIS — H1013 Acute atopic conjunctivitis, bilateral: Secondary | ICD-10-CM

## 2021-04-27 MED ORDER — AZELASTINE-FLUTICASONE 137-50 MCG/ACT NA SUSP
NASAL | 5 refills | Status: DC
  Filled 2021-04-27: qty 23, 30d supply, fill #0

## 2021-04-27 MED ORDER — AZELASTINE-FLUTICASONE 137-50 MCG/ACT NA SUSP
NASAL | 5 refills | Status: DC
  Filled 2021-04-27: qty 23, fill #0
  Filled 2021-05-10: qty 23, 30d supply, fill #0

## 2021-04-27 MED ORDER — CLOBETASOL PROPIONATE 0.05 % EX OINT
TOPICAL_OINTMENT | CUTANEOUS | 3 refills | Status: DC
  Filled 2021-04-27: qty 30, 14d supply, fill #0

## 2021-04-27 NOTE — Patient Instructions (Addendum)
-  Environmental allergy testing is positive to dust mites.  Obtaining environmental allergy panel via blood work to determine if you pollen allergy -Allergen avoidance measures discussed/handouts provided -Recommend long-acting antihistamine Xyzal or Allegra daily at this time.  These are both over-the-counter antihistamine options that will likely be more effective than Claritin -trial dymista 1 spray each nostril twice a day.  This is a combination nasal spray with Flonase + Astelin (nasal antihistamine).  This helps with both nasal congestion and drainage.  If this is covered by your insurance then you would stop your Flonase while using Dymista.  If this is not covered then will prescribe Astelin separately to use with the Flonase for nasal congestion and drainage control -For itchy watery eyes can use over-the-counter Pataday 1 drop each eye daily as needed -Allergen immunotherapy discussed today including protocol, benefits and risk.  Informational handout provided.  If interested in this therapuetic option you can check with your insurance carrier for coverage.  Let us know if you would like to proceed with this option.    -Allergy testing to pineapple is negative.  Will obtain serum IgE levels The oral allergy syndrome (OAS) or pollen-food allergy syndrome (PFAS) is a relatively common form of food allergy, particularly in adults. It typically occurs in people who have pollen allergies when the immune system "sees" proteins on the food that look like proteins on the pollen. This results in the allergy antibody (IgE) binding to the food instead of the pollen. Patients typically report itching and/or mild swelling of the mouth and throat immediately following ingestion of certain uncooked fruits (including nuts) or raw vegetables. Only a very small number of affected individuals experience systemic allergic reactions, such as anaphylaxis which occurs with true food allergies.       -Continue  moisturization of hands is much as possible with a thick emollient -For severe flares on the hands use clobetasol ointment twice daily as needed.  Do not use more than 2 weeks in a row. -For milder flares continue as needed use of triamcinolone twice a day -Discussed Dupixent injections today for add-on eczema therapy control.  Informational brochure provided  Follow-up in 4 months or sooner if needed

## 2021-04-27 NOTE — Progress Notes (Signed)
New Patient Note  RE: Brittany Morrow MRN: [id number] DOB: Dec 21, 1986 Date of Office Visit: 04/27/2021  Referring provider: Willow Ora, MD Primary care provider: Willow Ora, MD  Chief Complaint: Allergies  History of present illness: Brittany Morrow is a 35 y.o. female presenting today for consultation for allergic rhinitis and dyshidrotic eczema.  She states for forever she has had pollen allergy where has sneezing and nasal drainage with exposure.  She also gets itchy eyes.  She also reports developing an itchy, red bumpy rash on her arms and legs.  She will use either HC or triamcinolone on the rash that does help.   She has taken claritin for years and at this point does not think it works.  Uses flonase twice a day 1 spray and helps her nasal symptoms some.    Several years ago she states she started developing blisters on her hands and would ooze and scab.  This became a recurrent issue.  This rash is very itchy.  She states part of the problem is her scratching making the hands flare more.  Her PCP did prescribe her ointment, triamcinolone which does help.  She states she has been diagnosed with dyshidrotic eczema.  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  She tries her best to keep her hands moisturized as much as she possibly can.  She states last year her hands got infected where she required antibiotics.    No history of asthma.  She does report pineapple makes her mouth itchy.     Review of systems: Review of Systems  Constitutional: Negative.   HENT:       See HPI  Eyes:       See HPI  Respiratory: Negative.   Cardiovascular: Negative.   Gastrointestinal: Negative.   Musculoskeletal: Negative.   Skin: Positive for itching and rash.  Neurological: Negative.     All other systems negative unless noted above in HPI  Past medical history: Past Medical History:  Diagnosis Date  .  Chlamydia 2002, 2005  . Dyshidrotic eczema   . Gonorrhea 2014  . History of cervical dysplasia 07/31/2012   VAIN I and CIN 1 cured with Laser vaporization of cervix and vagina 2006  . Hypothyroidism   . Irritable bowel syndrome with both constipation and diarrhea 07/06/2020  . Vitamin D deficiency     Past surgical history: Past Surgical History:  Procedure Laterality Date  . CHOLECYSTECTOMY    . LASER VAPORIZATION OF CERVIX AND VAGINA  2008   [facility]--DR FERNANDEZ  . REDUCTION MAMMAPLASTY      Family history:  Family History  Problem Relation Age of Onset  . Diabetes Father   . Alcohol abuse Father   . Diabetes Paternal Grandmother   . Hypertension Mother   . Healthy Daughter     Social history: Lives in an apartment with carpeting in the bedroom with electric heating and central cooling.  Dog in the home.  There is no concern for water damage, mildew or roaches in the home.  She is a Engineer, civil (consulting).  She has no smoking history.  Medication List: Current Outpatient Medications  Medication Sig Dispense Refill  . Azelastine-Fluticasone (DYMISTA) 137-50 MCG/ACT SUSP Use one spray in each nostril twice daily as directed. 23 g 5  . Cholecalciferol (VITAMIN D3 PO) Take by mouth daily.    . fluticasone (FLONASE) 50 MCG/ACT nasal spray Place  1 spray into both nostrils daily.    Marland Kitchen levothyroxine (SYNTHROID) 88 MCG tablet TAKE 1 TABLET BY MOUTH ONCE A DAY 90 tablet 3  . Loratadine (CLARITIN PO) Take by mouth daily as needed.    . Multiple Vitamin (MULTIVITAMIN) tablet Take 1 tablet by mouth daily.     No current facility-administered medications for this visit.    Known medication allergies: Allergies  Allergen Reactions  . Sulfa Antibiotics Palpitations  . Tramadol Itching     Physical examination: Blood pressure 110/70, pulse 92, temperature 98.2 F (36.8 C), temperature source Temporal, resp. rate 18, height 5' 3.5" (1.613 m), weight 184 lb 9.6 oz (83.7 kg), SpO2 96 %.  General:  Alert, interactive, in no acute distress. HEENT: PERRLA, TMs pearly gray, turbinates mildly edematous without discharge, post-pharynx non erythematous. Neck: Supple without lymphadenopathy. Lungs: Clear to auscultation without wheezing, rhonchi or rales. {no increased work of breathing. CV: Normal S1, S2 without murmurs. Abdomen: Nondistended, nontender. Skin: Erythematous rough papules over the lateral left arm. Extremities:  No clubbing, cyanosis or edema. Neuro:   Grossly intact.  Diagnositics/Labs: Allergy testing: Environmental allergy skin prick testing is positive to both dust mites. Intradermal testing is negative Skin prick to pineapple is negative Allergy testing results were read and interpreted by provider, documented by clinical staff.   Assessment and plan: Allergic rhinitis with conjunctivitis  -Environmental allergy testing is positive to dust mites.  Obtaining environmental allergy panel via blood work to determine if you pollen allergy -Allergen avoidance measures discussed/handouts provided -Recommend long-acting antihistamine Xyzal or Allegra daily at this time.  These are both over-the-counter antihistamine options that will likely be more effective than Claritin -trial dymista 1 spray each nostril twice a day.  This is a combination nasal spray with Flonase + Astelin (nasal antihistamine).  This helps with both nasal congestion and drainage.  If this is covered by your insurance then you would stop your Flonase while using Dymista.  If this is not covered then will prescribe Astelin separately to use with the Flonase for nasal congestion and drainage control -For itchy watery eyes can use over-the-counter Pataday 1 drop each eye daily as needed -Allergen immunotherapy discussed today including protocol, benefits and risk.  Informational handout provided.  If interested in this therapuetic option you can check with your insurance carrier for coverage.  Let us know if you  would like to proceed with this option.    Oral allergy syndrome -Allergy testing to pineapple is negative.  Will obtain serum IgE levels The oral allergy syndrome (OAS) or pollen-food allergy syndrome (PFAS) is a relatively common form of food allergy, particularly in adults. It typically occurs in people who have pollen allergies when the immune system "sees" proteins on the food that look like proteins on the pollen. This results in the allergy antibody (IgE) binding to the food instead of the pollen. Patients typically report itching and/or mild swelling of the mouth and throat immediately following ingestion of certain uncooked fruits (including nuts) or raw vegetables. Only a very small number of affected individuals experience systemic allergic reactions, such as anaphylaxis which occurs with true food allergies.    Dyshidrotic eczema -Continue moisturization of hands is much as possible with a thick emollient -For severe flares on the hands use clobetasol ointment twice daily as needed.  Do not use more than 2 weeks in a row. -For milder flares continue as needed use of triamcinolone twice a day -Discussed Dupixent injections today for add-on eczema  therapy control.  Informational brochure provided  Follow-up in 4 months or sooner if needed   I appreciate the opportunity to take part in [place] care. Please do not hesitate to contact me with questions.  Sincerely,   Margo Aye, MD Allergy/Immunology [facility]

## 2021-04-28 ENCOUNTER — Other Ambulatory Visit (HOSPITAL_COMMUNITY): Payer: Self-pay

## 2021-05-05 LAB — ALLERGENS W/TOTAL IGE AREA 2

## 2021-05-05 LAB — ALLERGEN, PINEAPPLE, F210: Pineapple IgE: <0.1 kU/L

## 2021-05-10 ENCOUNTER — Other Ambulatory Visit (HOSPITAL_COMMUNITY): Payer: Self-pay

## 2021-05-10 ENCOUNTER — Telehealth: Payer: Self-pay | Admitting: Allergy

## 2021-05-10 MED ORDER — AZELASTINE HCL 0.1 % NA SOLN
1.0000 | Freq: Two times a day (BID) | NASAL | 5 refills | Status: DC
  Filled 2021-05-10: qty 30, 90d supply, fill #0
  Filled 2021-09-25: qty 30, 50d supply, fill #1

## 2021-05-10 MED ORDER — FLUTICASONE PROPIONATE 50 MCG/ACT NA SUSP
1.0000 | Freq: Two times a day (BID) | NASAL | 5 refills | Status: DC
Start: 2021-05-10 — End: 2022-12-28
  Filled 2021-05-10: qty 16, 30d supply, fill #0
  Filled 2021-09-25: qty 16, 30d supply, fill #1
  Filled 2022-01-31: qty 16, 30d supply, fill #2

## 2021-05-10 MED FILL — Norethindrone Ace & Ethinyl Estradiol-FE Tab 1 MG-20 MCG: ORAL | 84 days supply | Qty: 84 | Fill #1 | Status: AC

## 2021-05-10 NOTE — Telephone Encounter (Signed)
Called and spoke to patient and informed patient that I would send in Astelin and Flonase to the [facility]. Patient agreed.

## 2021-05-10 NOTE — Telephone Encounter (Signed)
Patient called and states the pharmacy sent over a fax for a request for a prior authorization for Dymista and they have not received anything. Please call patient with an update on prior authorization.

## 2021-05-15 ENCOUNTER — Other Ambulatory Visit (HOSPITAL_COMMUNITY): Payer: Self-pay

## 2021-05-24 ENCOUNTER — Other Ambulatory Visit (HOSPITAL_COMMUNITY): Payer: Self-pay

## 2021-05-30 ENCOUNTER — Other Ambulatory Visit (HOSPITAL_COMMUNITY): Payer: Self-pay

## 2021-05-30 MED FILL — Levothyroxine Sodium Tab 88 MCG: ORAL | 90 days supply | Qty: 90 | Fill #0 | Status: AC

## 2021-07-04 ENCOUNTER — Telehealth: Payer: No Typology Code available for payment source | Admitting: Physician Assistant

## 2021-07-04 DIAGNOSIS — U071 COVID-19: Secondary | ICD-10-CM

## 2021-07-04 MED ORDER — BENZONATATE 100 MG PO CAPS
100.0000 mg | ORAL_CAPSULE | Freq: Three times a day (TID) | ORAL | 0 refills | Status: DC | PRN
  Filled 2021-07-04: qty 30, 10d supply, fill #0

## 2021-07-04 NOTE — Progress Notes (Signed)

## 2021-07-04 NOTE — Progress Notes (Signed)
I have spent 5 minutes in review of e-visit questionnaire, review and updating patient chart, medical decision making and response to patient.   Telisha Zawadzki Cody Dominyk Law, PA-C    

## 2021-07-05 ENCOUNTER — Other Ambulatory Visit (HOSPITAL_COMMUNITY): Payer: Self-pay

## 2021-07-09 ENCOUNTER — Encounter: Payer: No Typology Code available for payment source | Admitting: Family Medicine

## 2021-07-16 ENCOUNTER — Other Ambulatory Visit: Payer: Self-pay

## 2021-07-16 ENCOUNTER — Ambulatory Visit: Payer: No Typology Code available for payment source | Admitting: Family

## 2021-07-16 ENCOUNTER — Other Ambulatory Visit (HOSPITAL_COMMUNITY): Payer: Self-pay

## 2021-07-16 ENCOUNTER — Encounter: Payer: Self-pay | Admitting: Family

## 2021-07-16 VITALS — BP 120/78 | HR 82 | Temp 97.6°F | Resp 18 | Ht 63.0 in | Wt 193.6 lb

## 2021-07-16 DIAGNOSIS — J3089 Other allergic rhinitis: Secondary | ICD-10-CM | POA: Diagnosis not present

## 2021-07-16 DIAGNOSIS — R21 Rash and other nonspecific skin eruption: Secondary | ICD-10-CM | POA: Diagnosis not present

## 2021-07-16 DIAGNOSIS — L301 Dyshidrosis [pompholyx]: Secondary | ICD-10-CM | POA: Diagnosis not present

## 2021-07-16 DIAGNOSIS — T781XXA Other adverse food reactions, not elsewhere classified, initial encounter: Secondary | ICD-10-CM

## 2021-07-16 DIAGNOSIS — H1013 Acute atopic conjunctivitis, bilateral: Secondary | ICD-10-CM

## 2021-07-16 MED ORDER — TRIAMCINOLONE ACETONIDE 0.1 % EX OINT
TOPICAL_OINTMENT | CUTANEOUS | 1 refills | Status: DC
Start: 2021-07-16 — End: 2022-11-28
  Filled 2021-07-16: qty 80, 30d supply, fill #0

## 2021-07-16 MED ORDER — LEVOCETIRIZINE DIHYDROCHLORIDE 5 MG PO TABS
5.0000 mg | ORAL_TABLET | Freq: Every evening | ORAL | 5 refills | Status: DC
  Filled 2021-07-16: qty 30, 30d supply, fill #0

## 2021-07-16 NOTE — Patient Instructions (Addendum)
Rash Prednisone taking 10 mg 2 tablets twice a day for 3 days, then on the fourth day take 2 tablets in the morning, on the fifth day take 1 tablet and stop Stop Claritin Start Xyzal 5 mg taking 1 tablet once a day to twice a day as needed for itching For the dark discoloration on your upper chest, bilateral upper arms and around her mouth/chin area please schedule an appointment with your dermatologist.  Perennial allergic rhinitis - Continue avoidance measures directed towards dust mite -Start Xyzal as above - continue Flonase (fluticasone nasal spray)1-2 sprays each nostril once a day as needed for stuffy nose - continue Astelin (azelastine ) nasal spray 1-2 sprays each nostril 1-2 times a day as needed for runny nose/drainage down throat. -For itchy watery eyes can use over-the-counter Pataday 1 drop each eye daily as needed -Allergen immunotherapy discussed today including protocol, benefits and risk.  Informational handout provided.  If interested in this therapuetic option you can check with your insurance carrier for coverage.  Let us know if you would like to proceed with this option.    -Allergy testing and lab work to pineapple were negative.  The oral allergy syndrome (OAS) or pollen-food allergy syndrome (PFAS) is a relatively common form of food allergy, particularly in adults. It typically occurs in people who have pollen allergies when the immune system "sees" proteins on the food that look like proteins on the pollen. This results in the allergy antibody (IgE) binding to the food instead of the pollen. Patients typically report itching and/or mild swelling of the mouth and throat immediately following ingestion of certain uncooked fruits (including nuts) or raw vegetables. Only a very small number of affected individuals experience systemic allergic reactions, such as anaphylaxis which occurs with true food all  Dyshidrotic eczema -Continue moisturization of hands is much as  possible with a thick emollient -For severe flares on the hands use clobetasol ointment twice daily as needed.  Do not use more than 2 weeks in a row. -For milder flares continue as needed use of triamcinolone twice a day -consider Dupixent injections today for add-on eczema therapy control.   Please let us know if this treatment plan is not working well for you.   Your already scheduled appointment on September 1 at 4:50 PM with Dr. Delorse Lek

## 2021-07-16 NOTE — Progress Notes (Signed)
[address] Deb[place]a Presto [place] [address] Dept: [phone]  FOLLOW UP NOTE  Patient ID: Brittany Morrow, female    DOB: 07-Sep-1986  Age: 35 y.o. MRN: [id number] Date of Office Visit: 07/16/2021  Assessment  Chief Complaint: Rash (Since Tuesday-Wednesday of last week all over the body ), Other (Black spots on chest and round her mouth and arms. They do not itch ), Eczema, and Allergic Rhinitis  (No flares or symptoms )  HPI Brittany Morrow is a 35 year old female who presents today for an acute visit of rash.  She was last seen on April 27, 2021 by Dr. Delorse Lek for allergic rhinitis with conjunctivitis, oral allergy syndrome, and dyshidrotic eczema.  She reports that she just got back from [place] Saturday night but this past Wednesday she started having redness and itching all over her body.  She reports that this is normal for her that anytime after she is at the beach or in a pool she will break out from head to toe and this will usually go away in a couple of days.  She has been using hydrocortisone cream, Claritin once a day, calamine lotion, and Sarna lotion.  She reports these areas itch and burn.  She denies any new medications, no new foods, no new products.  She did have COVID-19 approximately 10 days ago.  She did report that she does have bug bites on the back of her legs.  She denies any fever or joint pain.  She also mentions that on Thursday or Friday this past week she started developing dark spots on her chest, bilateral upper arms and she also noticed dark discoloration around her mouth and her chin region.  These areas do not itch.  She wonders if they could be due to a fungal infection.  She reports that while in [place] she was swimming in different caves.  She has seen a dermatologist in the past for her eczema.  Allergic rhinitis with conjunctivitis is reported as moderately controlled with Claritin 10 mg once a day, Flonase once a day, and Astelin nasal spray once a day.   She reports clear rhinorrhea and nasal congestion.  She denies any postnasal drip.  She has not had any sinus infections since we last saw her.  Dyshidrotic eczema is reported as doing better.  She reports that the clobetasol ointment helped with the eczema on her hands and that they look good now.   Drug Allergies:  Allergies  Allergen Reactions   Sulfa Antibiotics Palpitations   Tramadol Itching    Review of Systems: Review of Systems  Constitutional:  Negative for chills and fever.  HENT:         Reports clear rhinorrhea and nasal congestion.  Denies postnasal drip  Eyes:        Denies itchy watery eyes  Respiratory:  Negative for cough, shortness of breath and wheezing.   Cardiovascular:  Negative for chest pain and palpitations.  Gastrointestinal:        Denies heartburn or reflux symptoms  Genitourinary:  Negative for dysuria.  Skin:  Positive for itching and rash.  Neurological:  Negative for headaches.  Endo/Heme/Allergies:  Positive for environmental allergies.    Physical Exam: BP 120/78   Pulse 82   Temp 97.6 F (36.4 C)   Resp 18   Ht 5\' 3"  (1.6 m)   Wt 193 lb 9.6 oz (87.8 kg)   SpO2 98%   BMI 34.29 kg/m    Physical Exam  Constitutional:      Appearance: Normal appearance.  HENT:     Head: Normocephalic and atraumatic.     Comments: Pharynx normal, eyes normal, ears normal, nose bilateral lower turbinates mildly edematous with clear drainage noted    Right Ear: Tympanic membrane, ear canal and external ear normal.     Left Ear: Tympanic membrane, ear canal and external ear normal.     Mouth/Throat:     Mouth: Mucous membranes are moist.     Pharynx: Oropharynx is clear.  Eyes:     Conjunctiva/sclera: Conjunctivae normal.  Cardiovascular:     Rate and Rhythm: Normal rate and regular rhythm.     Heart sounds: Normal heart sounds.  Pulmonary:     Effort: Pulmonary effort is normal.     Breath sounds: Normal breath sounds.     Comments: Lungs clear  to auscultation Musculoskeletal:     Cervical back: Neck supple.  Skin:    General: Skin is warm.     Comments: Fine papular rash noted on bilateral upper and lower legs, bilateral arms, and abdomen.  Small hyper pigmented lesions noted on upper chest, bilateral upper arms near axilla, and hyperpigmented areas noted on chin and perioral region  Neurological:     Mental Status: She is alert.    Diagnostics:  None  Assessment and Plan: 1. Rash and nonspecific skin eruption   2. Non-seasonal allergic rhinitis due to other allergic trigger   3. Allergic conjunctivitis of both eyes   4. Pollen-food allergy, initial encounter   5. Dyshidrotic eczema     No orders of the defined types were placed in this encounter.   Patient Instructions  Rash Prednisone taking 10 mg 2 tablets twice a day for 3 days, then on the fourth day take 2 tablets in the morning, on the fifth day take 1 tablet and stop Stop Claritin Start Xyzal 5 mg taking 1 tablet once a day to twice a day as needed for itching For the dark discoloration on your upper chest, bilateral upper arms and around her mouth/chin area please schedule an appointment with your dermatologist.  Perennial allergic rhinitis - Continue avoidance measures directed towards dust mite -Start Xyzal as above - continue Flonase (fluticasone nasal spray)1-2 sprays each nostril once a day as needed for stuffy nose - continue Astelin (azelastine ) nasal spray 1-2 sprays each nostril 1-2 times a day as needed for runny nose/drainage down throat. -For itchy watery eyes can use over-the-counter Pataday 1 drop each eye daily as needed -Allergen immunotherapy discussed today including protocol, benefits and risk.  Informational handout provided.  If interested in this therapuetic option you can check with your insurance carrier for coverage.  Let us know if you would like to proceed with this option.    -Allergy testing and lab work to pineapple were  negative.  The oral allergy syndrome (OAS) or pollen-food allergy syndrome (PFAS) is a relatively common form of food allergy, particularly in adults. It typically occurs in people who have pollen allergies when the immune system "sees" proteins on the food that look like proteins on the pollen. This results in the allergy antibody (IgE) binding to the food instead of the pollen. Patients typically report itching and/or mild swelling of the mouth and throat immediately following ingestion of certain uncooked fruits (including nuts) or raw vegetables. Only a very small number of affected individuals experience systemic allergic reactions, such as anaphylaxis which occurs with true food all  Dyshidrotic eczema -  Continue moisturization of hands is much as possible with a thick emollient -For severe flares on the hands use clobetasol ointment twice daily as needed.  Do not use more than 2 weeks in a row. -For milder flares continue as needed use of triamcinolone twice a day -consider Dupixent injections today for add-on eczema therapy control.   Please let us know if this treatment plan is not working well for you.   Your already scheduled appointment on September 1 at 4:50 PM with Dr. Delorse Lek Return in about 6 weeks (around 08/30/2021), or if symptoms worsen or fail to improve.    Thank you for the opportunity to care for this patient.  Please do not hesitate to contact me with questions.  Nehemiah Settle, FNP [facility]

## 2021-07-19 ENCOUNTER — Other Ambulatory Visit (HOSPITAL_COMMUNITY): Payer: Self-pay

## 2021-08-21 ENCOUNTER — Other Ambulatory Visit: Payer: Self-pay | Admitting: Family Medicine

## 2021-08-21 ENCOUNTER — Other Ambulatory Visit (HOSPITAL_COMMUNITY): Payer: Self-pay

## 2021-08-21 DIAGNOSIS — E039 Hypothyroidism, unspecified: Secondary | ICD-10-CM

## 2021-08-21 MED ORDER — LEVOTHYROXINE SODIUM 88 MCG PO TABS
ORAL_TABLET | Freq: Every day | ORAL | 0 refills | Status: DC
  Filled 2021-08-21: qty 90, 90d supply, fill #0

## 2021-08-30 ENCOUNTER — Ambulatory Visit: Payer: No Typology Code available for payment source | Admitting: Allergy

## 2021-09-10 ENCOUNTER — Encounter: Payer: Self-pay | Admitting: Family Medicine

## 2021-09-10 ENCOUNTER — Telehealth (INDEPENDENT_AMBULATORY_CARE_PROVIDER_SITE_OTHER): Payer: No Typology Code available for payment source | Admitting: Family Medicine

## 2021-09-10 VITALS — Wt 189.0 lb

## 2021-09-10 DIAGNOSIS — E669 Obesity, unspecified: Secondary | ICD-10-CM

## 2021-09-10 NOTE — Progress Notes (Signed)
Virtual Visit via Video Note  Subjective  CC:  Chief Complaint  Patient presents with   Weight Loss    Healthy Weight and Wellness Referral     I connected with Brittany Morrow on 09/10/21 at  4:00 PM EDT by a video enabled telemedicine application and verified that I am speaking with the correct person using two identifiers. Location patient: Home Location provider: [facility] [address], Office Persons participating in the virtual visit: Brittany Morrow, Willow Ora, MD Jolyne Loa CMA  I discussed the limitations of evaluation and management by telemedicine and the availability of in person appointments. The patient expressed understanding and agreed to proceed. HPI: Brittany Morrow is a 35 y.o. female who was contacted today to address the problems listed above in the chief complaint. 35 yo female w/o BMI 34 has been working on weight loss w/o success. Has used diet and exercise in years past and has been successful but now having problems.  Skips breakfast, eats salad for lunch and then backed chicken/rice etc for dinner. Some snacking. But not "overeating". No fast food or sweetened beverages. Little exercise.  Has not used meds before for weight loss. Would like to see medical weight loss center to help.  No comorbidities.  Has low thyroid and last checked 7.2022. she has cpe scheduled for November.  Assessment  1. Obesity (BMI 30.0-34.9)      Plan  obesity:  diet and nutrition counseling started. Referrral placed.  I discussed the assessment and treatment plan with the patient. The patient was provided an opportunity to ask questions and all were answered. The patient agreed with the plan and demonstrated an understanding of the instructions.   The patient was advised to call back or seek an in-person evaluation if the symptoms worsen or if the condition fails to improve as anticipated. Follow up: for cpe  11/20/2021  No orders of the defined  types were placed in this encounter.     I reviewed the patients updated PMH, FH, and SocHx.    Patient Active Problem List   Diagnosis Date Noted   Vitamin B12 deficiency 07/06/2020   Irritable bowel syndrome with both constipation and diarrhea 07/06/2020   Oral contraceptive use 07/06/2020   Obesity (BMI 30.0-34.9) 03/11/2017   Acquired hypothyroidism 12/27/2015   History of cervical dysplasia 07/31/2012   Current Meds  Medication Sig   azelastine (ASTELIN) 0.1 % nasal spray Place 1 spray into both nostrils 2 (two) times daily.   benzonatate (TESSALON) 100 MG capsule Take 1 capsule (100 mg total) by mouth 3 (three) times daily as needed for cough.   Cholecalciferol (VITAMIN D3 PO) Take by mouth daily.   clobetasol ointment (TEMOVATE) 0.05 % Can apply to eczema on hands twice daily as needed during severe flares.  Do not use for longer than two weeks in a row.   fluticasone (FLONASE) 50 MCG/ACT nasal spray Place 1 spray into both nostrils in the morning and at bedtime.   levocetirizine (XYZAL) 5 MG tablet Take 1 tablet (5 mg total) by mouth every evening.   levothyroxine (SYNTHROID) 88 MCG tablet TAKE 1 TABLET BY MOUTH ONCE A DAY   Multiple Vitamin (MULTIVITAMIN) tablet Take 1 tablet by mouth daily.   triamcinolone ointment (KENALOG) 0.1 % Use sparingly twice a day as needed to red itchy areas.  (Do not use on face, neck, groin, or armpit region)    Allergies: Patient is allergic  to sulfa antibiotics and tramadol. Family History: Patient family history includes Alcohol abuse in her father; Diabetes in her father and paternal grandmother; Healthy in her daughter; Hypertension in her mother. Social History:  Patient  reports that she has never smoked. She has never used smokeless tobacco. She reports current alcohol use. She reports that she does not use drugs.  Review of Systems: Constitutional: Negative for fever malaise or anorexia Cardiovascular: negative for chest  pain Respiratory: negative for SOB or persistent cough Gastrointestinal: negative for abdominal pain  OBJECTIVE Vitals: Wt 189 lb (85.7 kg)   BMI 33.48 kg/m  General: no acute distress , A&Ox3  Willow Ora, MD

## 2021-09-14 ENCOUNTER — Other Ambulatory Visit (HOSPITAL_COMMUNITY): Payer: Self-pay

## 2021-09-14 ENCOUNTER — Other Ambulatory Visit: Payer: Self-pay

## 2021-09-25 ENCOUNTER — Other Ambulatory Visit (HOSPITAL_COMMUNITY): Payer: Self-pay

## 2021-09-25 MED ORDER — PHENTERMINE HCL 37.5 MG PO TABS
ORAL_TABLET | ORAL | 0 refills | Status: DC
  Filled 2021-09-25: qty 30, 30d supply, fill #0

## 2021-10-02 ENCOUNTER — Encounter: Payer: No Typology Code available for payment source | Admitting: Family Medicine

## 2021-10-08 ENCOUNTER — Other Ambulatory Visit (HOSPITAL_COMMUNITY): Payer: Self-pay

## 2021-10-08 MED ORDER — NORETHIN ACE-ETH ESTRAD-FE 1-20 MG-MCG PO TABS
ORAL_TABLET | ORAL | 2 refills | Status: DC
  Filled 2021-10-08: qty 84, 84d supply, fill #0
  Filled 2022-02-06: qty 84, 84d supply, fill #1
  Filled 2022-05-06: qty 84, 84d supply, fill #2

## 2021-10-09 ENCOUNTER — Other Ambulatory Visit (HOSPITAL_COMMUNITY): Payer: Self-pay

## 2021-10-09 ENCOUNTER — Telehealth: Payer: No Typology Code available for payment source | Admitting: Emergency Medicine

## 2021-10-09 DIAGNOSIS — N898 Other specified noninflammatory disorders of vagina: Secondary | ICD-10-CM | POA: Diagnosis not present

## 2021-10-09 MED ORDER — METRONIDAZOLE 500 MG PO TABS
500.0000 mg | ORAL_TABLET | Freq: Two times a day (BID) | ORAL | 0 refills | Status: DC
  Filled 2021-10-09: qty 14, 7d supply, fill #0

## 2021-10-09 MED ORDER — FLUCONAZOLE 150 MG PO TABS
150.0000 mg | ORAL_TABLET | Freq: Once | ORAL | 0 refills | Status: AC
  Filled 2021-10-09: qty 2, 2d supply, fill #0

## 2021-10-09 NOTE — Progress Notes (Signed)
E-Visit for Vaginal Symptoms  We are sorry that you are not feeling well. Here is how we plan to help! Based on what you shared with me it looks like you: May have a vaginosis due to bacteria  Vaginosis is an inflammation of the vagina that can result in discharge, itching and pain. The cause is usually a change in the normal balance of vaginal bacteria or an infection. Vaginosis can also result from reduced estrogen levels after menopause.  The most common causes of vaginosis are:   Bacterial vaginosis which results from an overgrowth of one on several organisms that are normally present in your vagina.   Yeast infections which are caused by a naturally occurring fungus called candida.   Vaginal atrophy (atrophic vaginosis) which results from the thinning of the vagina from reduced estrogen levels after menopause.   Trichomoniasis which is caused by a parasite and is commonly transmitted by sexual intercourse.  Factors that increase your risk of developing vaginosis include: Medications, such as antibiotics and steroids Uncontrolled diabetes Use of hygiene products such as bubble bath, vaginal spray or vaginal deodorant Douching Wearing damp or tight-fitting clothing Using an intrauterine device (IUD) for birth control Hormonal changes, such as those associated with pregnancy, birth control pills or menopause Sexual activity Having a sexually transmitted infection  Your treatment plan is Metronidazole or Flagyl 500mg  twice a day for 7 days.  I have electronically sent this prescription into the pharmacy that you have chosen.  I've sent 2 tablets of Diflucan, take 1 today and 1 when you finish the antibiotic.  Be sure to take all of the medication as directed. Stop taking any medication if you develop a rash, tongue swelling or shortness of breath. Mothers who are breast feeding should consider pumping and discarding their breast milk while on these antibiotics. However, there is no  consensus that infant exposure at these doses would be harmful.  Remember that medication creams can weaken latex condoms.   HOME CARE:  Good hygiene may prevent some types of vaginosis from recurring and may relieve some symptoms:  Avoid baths, hot tubs and whirlpool spas. Rinse soap from your outer genital area after a shower, and dry the area well to prevent irritation. Don't use scented or harsh soaps, such as those with deodorant or antibacterial action. Avoid irritants. These include scented tampons and pads. Wipe from front to back after using the toilet. Doing so avoids spreading fecal bacteria to your vagina.  Other things that may help prevent vaginosis include:  Don't douche. Your vagina doesn't require cleansing other than normal bathing. Repetitive douching disrupts the normal organisms that reside in the vagina and can actually increase your risk of vaginal infection. Douching won't clear up a vaginal infection. Use a latex condom. Both female and female latex condoms may help you avoid infections spread by sexual contact. Wear cotton underwear. Also wear pantyhose with a cotton crotch. If you feel comfortable without it, skip wearing underwear to bed. Yeast thrives in [place] Your symptoms should improve in the next day or two.  GET HELP RIGHT AWAY IF:  You have pain in your lower abdomen ( pelvic area or over your ovaries) You develop nausea or vomiting You develop a fever Your discharge changes or worsens You have persistent pain with intercourse You develop shortness of breath, a rapid pulse, or you faint.  These symptoms could be signs of problems or infections that need to be evaluated by a medical provider now.  MAKE SURE YOU   Understand these instructions. Will watch your condition. Will get help right away if you are not doing well or get worse.  Thank you for choosing an e-visit.  Your e-visit answers were reviewed by a board certified  advanced clinical practitioner to complete your personal care plan. Depending upon the condition, your plan could have included both over the counter or prescription medications.  Please review your pharmacy choice. Make sure the pharmacy is open so you can pick up prescription now. If there is a problem, you may contact your provider through [employer] and have the prescription routed to another pharmacy.  Your safety is important to [place]. If you have drug allergies check your prescription carefully.   For the next 24 hours you can use MyChart to ask questions about today's visit, request a non-urgent call back, or ask for a work or school excuse. You will get an email in the next two days asking about your experience. I hope that your e-visit has been valuable and will speed your recovery.  Approximately 5 minutes was used in reviewing the patient's chart, questionnaire, prescribing medications, and documentation.

## 2021-11-20 ENCOUNTER — Ambulatory Visit (INDEPENDENT_AMBULATORY_CARE_PROVIDER_SITE_OTHER): Payer: No Typology Code available for payment source | Admitting: Family Medicine

## 2021-11-20 ENCOUNTER — Other Ambulatory Visit (HOSPITAL_COMMUNITY): Payer: Self-pay

## 2021-11-20 ENCOUNTER — Other Ambulatory Visit: Payer: Self-pay

## 2021-11-20 ENCOUNTER — Encounter: Payer: Self-pay | Admitting: Family Medicine

## 2021-11-20 VITALS — BP 118/80 | HR 80 | Temp 97.9°F | Ht 63.0 in | Wt 189.6 lb

## 2021-11-20 DIAGNOSIS — E039 Hypothyroidism, unspecified: Secondary | ICD-10-CM

## 2021-11-20 DIAGNOSIS — Z3041 Encounter for surveillance of contraceptive pills: Secondary | ICD-10-CM

## 2021-11-20 DIAGNOSIS — Z0000 Encounter for general adult medical examination without abnormal findings: Secondary | ICD-10-CM

## 2021-11-20 DIAGNOSIS — E538 Deficiency of other specified B group vitamins: Secondary | ICD-10-CM

## 2021-11-20 DIAGNOSIS — K582 Mixed irritable bowel syndrome: Secondary | ICD-10-CM

## 2021-11-20 DIAGNOSIS — E669 Obesity, unspecified: Secondary | ICD-10-CM

## 2021-11-20 LAB — CBC WITH DIFFERENTIAL/PLATELET
Basophils Absolute: 0 10*3/uL (ref 0.0–0.1)
Basophils Relative: 0.2 % (ref 0.0–3.0)
Eosinophils Absolute: 0.1 10*3/uL (ref 0.0–0.7)
Eosinophils Relative: 1.5 % (ref 0.0–5.0)
HCT: 39.7 % (ref 36.0–46.0)
Hemoglobin: 12.8 g/dL (ref 12.0–15.0)
Lymphocytes Relative: 21.9 % (ref 12.0–46.0)
Lymphs Abs: 1.7 10*3/uL (ref 0.7–4.0)
MCHC: 32.3 g/dL (ref 30.0–36.0)
MCV: 86.3 fl (ref 78.0–100.0)
Monocytes Absolute: 0.6 10*3/uL (ref 0.1–1.0)
Monocytes Relative: 7.5 % (ref 3.0–12.0)
Neutro Abs: 5.2 10*3/uL (ref 1.4–7.7)
Neutrophils Relative %: 68.9 % (ref 43.0–77.0)
Platelets: 246 10*3/uL (ref 150.0–400.0)
RBC: 4.6 Mil/uL (ref 3.87–5.11)
RDW: 13.8 % (ref 11.5–15.5)
WBC: 7.6 10*3/uL (ref 4.0–10.5)

## 2021-11-20 LAB — COMPREHENSIVE METABOLIC PANEL
ALT: 26 U/L (ref 0–35)
AST: 22 U/L (ref 0–37)
Albumin: 4.4 g/dL (ref 3.5–5.2)
Alkaline Phosphatase: 70 U/L (ref 39–117)
BUN: 9 mg/dL (ref 6–23)
CO2: 26 mEq/L (ref 19–32)
Calcium: 8.9 mg/dL (ref 8.4–10.5)
Chloride: 104 mEq/L (ref 96–112)
Creatinine, Ser: 0.66 mg/dL (ref 0.40–1.20)
GFR: 113.91 mL/min (ref >60.00)
Glucose, Bld: 88 mg/dL (ref 70–99)
Potassium: 4.1 mEq/L (ref 3.5–5.1)
Sodium: 138 mEq/L (ref 135–145)
Total Bilirubin: 0.7 mg/dL (ref 0.2–1.2)
Total Protein: 7.3 g/dL (ref 6.0–8.3)

## 2021-11-20 LAB — LIPID PANEL
Cholesterol: 182 mg/dL (ref 0–200)
HDL: 65.3 mg/dL (ref >39.00)
LDL Cholesterol: 101 mg/dL — ABNORMAL HIGH (ref 0–99)
NonHDL: 116.32
Total CHOL/HDL Ratio: 3
Triglycerides: 77 mg/dL (ref 0.0–149.0)
VLDL: 15.4 mg/dL (ref 0.0–40.0)

## 2021-11-20 LAB — HEMOGLOBIN A1C: Hgb A1c MFr Bld: 5.6 % (ref 4.6–6.5)

## 2021-11-20 LAB — TSH: TSH: 1.96 u[IU]/mL (ref 0.35–5.50)

## 2021-11-20 LAB — VITAMIN D 25 HYDROXY (VIT D DEFICIENCY, FRACTURES): VITD: 29.4 ng/mL — ABNORMAL LOW (ref 30.00–100.00)

## 2021-11-20 LAB — VITAMIN B12: Vitamin B-12: 470 pg/mL (ref 211–911)

## 2021-11-20 MED ORDER — OZEMPIC (0.25 OR 0.5 MG/DOSE) 2 MG/1.5ML ~~LOC~~ SOPN
PEN_INJECTOR | SUBCUTANEOUS | 1 refills | Status: DC
  Filled 2021-11-20: qty 1.5, 42d supply, fill #0
  Filled 2022-01-08: qty 1.5, 28d supply, fill #1
  Filled 2022-02-05: qty 1.5, 42d supply, fill #2

## 2021-11-20 NOTE — Progress Notes (Signed)
Subjective  Chief Complaint  Patient presents with   Annual Exam    Had coffee with cream   Hypothyroidism    HPI: Brittany Morrow is a 35 y.o. female who presents to [facility] today for a Female Wellness Visit.  She also has the concerns and/or needs as listed above in the chief complaint. These will be addressed in addition to the Health Maintenance Visit.   Wellness Visit: annual visit with health maintenance review and exam without Pap  HM: sees GYN; screens are current. Imms are up to date. On ocps.  Chronic disease management visit and/or acute problem visit: Low thyroid: feels well. Nl energy. Compliant. Due for recheck. Needs refill. IBS: sxs are manageable. No recent active flares.  Obesity: has referralto healthy weight and wellness but they are booked out a ways. Inquires about meds to help. Failed phentermine due to side effects. Worries about insulin resistance. + FH of diabetes. No sxs of hyperglycemia. Eating well. Active lifestyle.   Assessment  1. Annual physical exam   2. Acquired hypothyroidism   3. Irritable bowel syndrome with both constipation and diarrhea   4. Obesity (BMI 30.0-34.9)   5. Oral contraceptive use   6. Vitamin B12 deficiency      Plan  Female Wellness Visit: Age appropriate Health Maintenance and Prevention measures were discussed with patient. Included topics are cancer screening recommendations, ways to keep healthy (see AVS) including dietary and exercise recommendations, regular eye and dental care, use of seat belts, and avoidance of moderate alcohol use and tobacco use. Screens are current BMI: discussed patient's BMI and encouraged positive lifestyle modifications to help get to or maintain a target BMI. HM needs and immunizations were addressed and ordered. See below for orders. See HM and immunization section for updates. Routine labs and screening tests ordered including cmp, cbc and lipids where  appropriate. Discussed recommendations regarding Vit D and calcium supplementation (see AVS)  Chronic disease f/u and/or acute problem visit: (deemed necessary to be done in addition to the wellness visit): Low thyroid :  clinically euthyroid. Recheck today.  Obesity: counseling done. Eating well. Trial of ozempic. Education given. Start low dose.  Ibs: stable. No meds Recheck b12 levels on oral supplements.   Follow up: Return in about 3 months (around 02/20/2022) for recheck.   Orders Placed This Encounter  Procedures   CBC with Differential/Platelet   Comprehensive metabolic panel   Lipid panel   Hemoglobin A1c   TSH   Vitamin B12   VITAMIN D 25 Hydroxy (Vit-D Deficiency, Fractures)   Meds ordered this encounter  Medications   Semaglutide,0.25 or 0.5MG /DOS, (OZEMPIC, 0.25 OR 0.5 MG/DOSE,) 2 MG/1.5ML SOPN    Sig: Inject 0.25 mg into the skin once a week for 28 days, THEN 0.5 mg once a week.    Dispense:  4.5 mL    Refill:  1        Body mass index is 33.59 kg/m. Wt Readings from Last 3 Encounters:  11/20/21 189 lb 9.6 oz (86 kg)  09/10/21 189 lb (85.7 kg)  07/16/21 193 lb 9.6 oz (87.8 kg)   Need for contraception: Yes, OCP (estrogen/progesterone)  Patient Active Problem List   Diagnosis Date Noted   Vitamin B12 deficiency 07/06/2020   Irritable bowel syndrome with both constipation and diarrhea 07/06/2020   Oral contraceptive use 07/06/2020   Obesity (BMI 30.0-34.9) 03/11/2017   Acquired hypothyroidism 12/27/2015   History of cervical dysplasia 07/31/2012  VAIN I and CIN 1 cured with Laser vaporization of cervix and vagina 2006    Health Maintenance  Topic Date Due   PAP SMEAR-Modifier  04/29/2022   TETANUS/TDAP  09/08/2023   INFLUENZA VACCINE  Completed   HPV VACCINES  Completed   Hepatitis C Screening  Completed   HIV Screening  Completed   Pneumococcal Vaccine 37-60 Years old  Aged Out   COVID-19 Vaccine  Discontinued   Immunization History   Administered Date(s) Administered   HPV Quadrivalent 09/16/2005, 11/20/2005, 05/02/2006   Influenza-Unspecified 09/13/2014, 09/19/2015, 09/29/2020, 10/08/2021   PFIZER(Purple Top)SARS-COV-2 Vaccination 01/18/2020, 02/07/2020   PPD Test 01/11/2012, 07/08/2014   Tdap 09/07/2013   We updated and reviewed the patient's past history in detail and it is documented below. Allergies: Patient  reports current alcohol use. Past Medical History Patient  has a past medical history of Chlamydia (2002, 2005), Dyshidrotic eczema, Gonorrhea (2014), History of cervical dysplasia (07/31/2012), Hypothyroidism, Irritable bowel syndrome with both constipation and diarrhea (07/06/2020), and Vitamin D deficiency. Past Surgical History Patient  has a past surgical history that includes Cholecystectomy; [place] (2008); and Reduction mammaplasty. Social History   Socioeconomic History   Marital status: Single    Spouse name: Not on file   Number of children: 1   Years of education: College   Highest education level: Not on file  Occupational History   Occupation: Programmer, multimedia: [employer]    Comment: [employer]  Tobacco Use   Smoking status: Never   Smokeless tobacco: Never  Substance and Sexual Activity   Alcohol use: Yes    Comment: RARE-SOCIALLY ONLY   Drug use: No   Sexual activity: Yes    Birth control/protection: Pill  Other Topics Concern   Not on file  Social History Narrative   Not on file   Social Determinants of Health   Financial Resource Strain: Not on file  Food Insecurity: Not on file  Transportation Needs: Not on file  Physical Activity: Not on file  Stress: Not on file  Social Connections: Not on file   Family History  Problem Relation Age of Onset   Diabetes Father    Alcohol abuse Father    Diabetes Paternal Grandmother    Hypertension Mother    Healthy Daughter     Review of Systems: Constitutional: negative for fever or  malaise Ophthalmic: negative for photophobia, double vision or loss of vision Cardiovascular: negative for chest pain, dyspnea on exertion, or new LE swelling Respiratory: negative for SOB or persistent cough Gastrointestinal: negative for abdominal pain, change in bowel habits or melena Genitourinary: negative for dysuria or gross hematuria, no abnormal uterine bleeding or disharge Musculoskeletal: negative for new gait disturbance or muscular weakness Integumentary: negative for new or persistent rashes, no breast lumps Neurological: negative for TIA or stroke symptoms Psychiatric: negative for SI or delusions Allergic/Immunologic: negative for hives  Patient Care Team    Relationship Specialty Notifications Start End  Leamon Arnt, MD PCP - General Family Medicine  11/03/19   Avon Gully, NP Nurse Practitioner Obstetrics and Gynecology  02/18/17   Kennith Gain, MD Consulting Physician Allergy  04/30/21     Objective  Vitals: BP 118/80   Pulse 80   Temp 97.9 F (36.6 C) (Temporal)   Ht 5\' 3"  (1.6 m)   Wt 189 lb 9.6 oz (86 kg)   LMP 11/16/2021   SpO2 97%   BMI 33.59 kg/m  General:  Well developed, well nourished, no acute distress  Psych:  Alert and orientedx3,normal mood and affect HEENT:  Normocephalic, atraumatic, non-icteric sclera, PERRL, supple neck without adenopathy, mass or thyromegaly Cardiovascular:  Normal S1, S2, RRR without gallop, rub or murmur Respiratory:  Good breath sounds bilaterally, CTAB with normal respiratory effort Gastrointestinal: normal bowel sounds, soft, non-tender, no noted masses. No HSM MSK: no deformities, contusions. Joints are without erythema or swelling.  Skin:  Warm, no rashes or suspicious lesions noted Neurologic:    Mental status is normal. Gross motor and sensory exams are normal. Normal gait. No tremor    Commons side effects, risks, benefits, and alternatives for medications and treatment plan prescribed today  were discussed, and the patient expressed understanding of the given instructions. Patient is instructed to call or message via MyChart if he/she has any questions or concerns regarding our treatment plan. No barriers to understanding were identified. We discussed Red Flag symptoms and signs in detail. Patient expressed understanding regarding what to do in case of urgent or emergency type symptoms.  Medication list was reconciled, printed and provided to the patient in AVS. Patient instructions and summary information was reviewed with the patient as documented in the AVS. This note was prepared with assistance of Dragon voice recognition software. Occasional wrong-word or sound-a-like substitutions may have occurred due to the inherent limitations of voice recognition software  This visit occurred during the SARS-CoV-2 public health emergency.  Safety protocols were in place, including screening questions prior to the visit, additional usage of staff PPE, and extensive cleaning of exam room while observing appropriate contact time as indicated for disinfecting solutions.

## 2021-11-20 NOTE — Patient Instructions (Signed)
Please return in 3 months to recheck weight on new medication.   I will release your lab results to you on your MyChart account with further instructions. Please reply with any questions.    Let me know if you have trouble with the ozempic.   If you have any questions or concerns, please don't hesitate to send me a message via MyChart or call the office at [phone]. Thank you for visiting with [place] today! It's our pleasure caring for you.

## 2021-11-21 ENCOUNTER — Other Ambulatory Visit (HOSPITAL_COMMUNITY): Payer: Self-pay

## 2021-11-21 MED ORDER — LEVOTHYROXINE SODIUM 88 MCG PO TABS
88.0000 ug | ORAL_TABLET | Freq: Every day | ORAL | 3 refills | Status: DC
  Filled 2021-11-21: qty 90, 90d supply, fill #0
  Filled 2022-03-20: qty 90, 90d supply, fill #1
  Filled 2022-07-04: qty 90, 90d supply, fill #2
  Filled 2022-10-14: qty 90, 90d supply, fill #3

## 2021-11-21 NOTE — Addendum Note (Signed)
Addended by: Asencion Partridge on: 11/21/2021 09:00 AM   Modules accepted: Orders

## 2021-12-07 ENCOUNTER — Other Ambulatory Visit (HOSPITAL_COMMUNITY): Payer: Self-pay

## 2021-12-07 MED ORDER — FLUCONAZOLE 150 MG PO TABS
ORAL_TABLET | ORAL | 0 refills | Status: DC
  Filled 2021-12-07: qty 3, 7d supply, fill #0

## 2022-01-04 ENCOUNTER — Ambulatory Visit
Admission: EM | Admit: 2022-01-04 | Discharge: 2022-01-04 | Disposition: A | Discharge status: Home / Self Care | Payer: No Typology Code available for payment source | Attending: Internal Medicine | Admitting: Internal Medicine

## 2022-01-04 ENCOUNTER — Other Ambulatory Visit: Payer: Self-pay

## 2022-01-04 DIAGNOSIS — J069 Acute upper respiratory infection, unspecified: Secondary | ICD-10-CM | POA: Diagnosis present

## 2022-01-04 DIAGNOSIS — J0100 Acute maxillary sinusitis, unspecified: Secondary | ICD-10-CM | POA: Diagnosis not present

## 2022-01-04 LAB — POCT INFLUENZA A/B
Influenza A, POC: NEGATIVE
Influenza B, POC: NEGATIVE

## 2022-01-04 LAB — POCT RAPID STREP A (OFFICE): Rapid Strep A Screen: NEGATIVE

## 2022-01-04 MED ORDER — AMOXICILLIN-POT CLAVULANATE 875-125 MG PO TABS: 1.0000 | ORAL_TABLET | Freq: Two times a day (BID) | ORAL | 0 refills | Status: AC

## 2022-01-04 MED ORDER — PREDNISONE 20 MG PO TABS: 40.0000 mg | ORAL_TABLET | Freq: Every day | ORAL | 0 refills | Status: AC

## 2022-01-04 NOTE — Discharge Instructions (Addendum)
Take antibiotic as prescribed to treat acute sinusitis. Take prednisone daily until going. Drink plenty of fluids, rest. Follow-up for any worsening or persistent symptoms.

## 2022-01-04 NOTE — ED Provider Notes (Signed)
[facility]    CSN: [id number] Arrival date & time: 01/04/22  1512      History   Chief Complaint No chief complaint on file.   HPI Brittany Morrow is a 36 y.o. female presents to urgent care today with complaints of ear pain presents to urgent care today with complaints of left-sided ear pain, facial pain, headache and sore throat x4 days.  Patient reports significant sinus headache on left side, chills, difficulty sleeping due to ear pain.  Patient denies any recent fever, chest pain, shortness of breath, abdominal pain, vomiting or diarrhea.  TheraFlu and ibuprofen providing temporary relief.  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   Hypothyroidism    Irritable bowel syndrome with both constipation and diarrhea 07/06/2020   Vitamin D deficiency     Patient Active Problem List   Diagnosis Date Noted   Vitamin B12 deficiency 07/06/2020   Irritable bowel syndrome with both constipation and diarrhea 07/06/2020   Oral contraceptive use 07/06/2020   Obesity (BMI 30.0-34.9) 03/11/2017   Acquired hypothyroidism 12/27/2015   History of cervical dysplasia 07/31/2012    Past Surgical History:  Procedure Laterality Date   CHOLECYSTECTOMY     LASER VAPORIZATION OF CERVIX AND VAGINA  2008   [facility]--DR FERNANDEZ   REDUCTION MAMMAPLASTY      OB History     Gravida  1   Para  1   Term      Preterm      AB      Living  1      SAB      IAB      Ectopic      Multiple      Live Births               Home Medications    Prior to Admission medications   Medication Sig Start Date End Date Taking? Authorizing Provider  amoxicillin-clavulanate (AUGMENTIN) 875-125 MG tablet Take 1 tablet by mouth 2 (two) times daily  for 10 days. 01/04/22 01/14/22 Yes Rudolpho Sevin, NP  ibuprofen (ADVIL) 100 MG tablet Take 200 mg by mouth every 6 (six) hours as needed for fever.   Yes [provider]  predniSONE (DELTASONE) 20 MG tablet Take 2 tablets (40 mg total) by mouth daily with breakfast for 5 days. 01/04/22 01/09/22 Yes Rudolpho Sevin, NP  azelastine (ASTELIN) 0.1 % nasal spray Place 1 spray into both nostrils 2 (two) times daily. 05/10/21   Kennith Gain, MD  Cholecalciferol (VITAMIN D3 PO) Take by mouth daily.    [provider]  clobetasol ointment (TEMOVATE) 0.05 % Can apply to eczema on hands twice daily as needed during severe flares.  Do not use for longer than two weeks in a row. 04/27/21   Padgett, Rae Halsted, MD  fluconazole (DIFLUCAN) 150 MG tablet Take 1 tablet by mouth on day 1, day 4, and day 7 as needed 12/07/21     fluticasone (FLONASE) 50 MCG/ACT nasal spray Place 1 spray into both nostrils in the morning and at bedtime. 05/10/21   Kennith Gain, MD  levothyroxine (SYNTHROID) 88 MCG tablet Take 1 tablet (88 mcg total) by mouth daily. 11/21/21  11/21/22  Leamon Arnt, MD  Multiple Vitamin (MULTIVITAMIN) tablet Take 1 tablet by mouth daily.    [provider]  norethindrone-ethinyl estradiol-FE (JUNEL FE 1/20) 1-20 MG-MCG tablet Take 1 tablet by mouth every day 10/08/21     Semaglutide,0.25 or 0.5MG /DOS, (OZEMPIC, 0.25 OR 0.5 MG/DOSE,) 2 MG/1.5ML SOPN Inject 0.25 mg into the skin once a week for 28 days, THEN 0.5 mg once a week. 11/20/21 02/18/22  Leamon Arnt, MD  triamcinolone ointment (KENALOG) 0.1 % Use sparingly twice a day as needed to red itchy areas.  (Do not use on face, neck, groin, or armpit region) 07/16/21   Althea Charon, FNP    Family History Family History  Problem Relation Age of Onset   Diabetes Father    Alcohol abuse Father    Diabetes Paternal Grandmother    Hypertension Mother    Healthy Daughter     Social History Social  History   Tobacco Use   Smoking status: Never   Smokeless tobacco: Never  Substance Use Topics   Alcohol use: Yes    Comment: RARE-SOCIALLY ONLY   Drug use: No     Allergies   Sulfa antibiotics and Tramadol   Review of Systems As stated in HPI otherwise negative   Physical Exam Triage Vital Signs ED Triage Vitals  Enc Vitals Group     BP 01/04/22 1647 111/78     Pulse Rate 01/04/22 1647 93     Resp 01/04/22 1647 18     Temp 01/04/22 1647 98.7 F (37.1 C)     Temp Source 01/04/22 1647 Oral     SpO2 01/04/22 1647 98 %     Weight --      Height --      Head Circumference --      Peak Flow --      Pain Score 01/04/22 1646 10     Pain Loc --      Pain Edu? --      Excl. in [place]? --    No data found.  Updated Vital Signs BP 111/78 (BP Location: Right Arm)    Pulse 93    Temp 98.7 F (37.1 C) (Oral)    Resp 18    LMP 12/19/2021    SpO2 98%   Visual Acuity Right Eye Distance:   Left Eye Distance:   Bilateral Distance:    Right Eye Near:   Left Eye Near:    Bilateral Near:     Physical Exam Constitutional:      General: She is not in acute distress.    Appearance: Normal appearance. She is not toxic-appearing.     Comments: Appears to feel unwell but in no acute distress  HENT:     Head: Normocephalic and atraumatic.     Ears:     Comments: Purulent effusion bilaterally to TMs, L>R, no bulging or retraction, no perforation    Nose: Congestion present.     Mouth/Throat:     Mouth: Mucous membranes are moist.     Pharynx: Posterior oropharyngeal erythema present. No oropharyngeal exudate.  Eyes:     General: No scleral icterus.    Extraocular Movements: Extraocular movements intact.     Conjunctiva/sclera: Conjunctivae normal.  Cardiovascular:     Rate and Rhythm: Normal rate and regular rhythm.  Pulmonary:     Effort: Pulmonary effort is normal.     Breath sounds: Normal breath sounds.  Abdominal:     General: Bowel  sounds are normal.     Palpations:  Abdomen is soft.  Musculoskeletal:     Cervical back: Neck supple.  Lymphadenopathy:     Cervical: No cervical adenopathy.  Skin:    General: Skin is warm and dry.  Neurological:     General: No focal deficit present.     Mental Status: She is alert and oriented to person, place, and time.  Psychiatric:        Mood and Affect: Mood normal.        Behavior: Behavior normal.     UC Treatments / Results  Labs (all labs ordered are listed, but only abnormal results are displayed) Labs Reviewed  CULTURE, GROUP A STREP [facility])  POCT RAPID STREP A (OFFICE)  POCT INFLUENZA A/B    EKG   Radiology No results found.  Procedures Procedures (including critical care time)  Medications Ordered in UC Medications - No data to display  Initial Impression / Assessment and Plan / UC Course  I have reviewed the triage vital signs and the nursing notes.  Pertinent labs & imaging results that were available during my care of the patient were reviewed by me and considered in my medical decision making (see chart for details).  Acute sinusitis Bilateral ear effusion, purulent -Rapid flu and strep are negative, VSS, nontoxic appearing -Exam consistent with acute sinusitis and with bilateral purulent effusions we will go ahead and treat with Augmentin twice daily x10 days -Short prednisone burst -Rest, drink plenty of fluids -Strict follow-up precautions discussed  Reviewed expections re: course of current medical issues. Questions answered. Outlined signs and symptoms indicating need for more acute intervention. Pt verbalized understanding. AVS given  Final Clinical Impressions(s) / UC Diagnoses   Final diagnoses:  Acute non-recurrent maxillary sinusitis     Discharge Instructions      Take antibiotic as prescribed to treat acute sinusitis. Take prednisone daily until going. Drink plenty of fluids, rest. Follow-up for any worsening or persistent symptoms.      ED  Prescriptions     Medication Sig Dispense Auth. Provider   amoxicillin-clavulanate (AUGMENTIN) 875-125 MG tablet Take 1 tablet by mouth 2 (two) times daily for 10 days. 28 tablet Rudolpho Sevin, NP   predniSONE (DELTASONE) 20 MG tablet Take 2 tablets (40 mg total) by mouth daily with breakfast for 5 days. 10 tablet Rudolpho Sevin, NP      PDMP not reviewed this encounter.   Rudolpho Sevin, NP 01/04/22 1836

## 2022-01-04 NOTE — ED Triage Notes (Addendum)
Pt reports having sore throat, left sided ear pain, and headache since Tuesday. Patient requesting to be tested for flu.

## 2022-01-08 ENCOUNTER — Other Ambulatory Visit (HOSPITAL_COMMUNITY): Payer: Self-pay

## 2022-01-08 ENCOUNTER — Telehealth: Payer: No Typology Code available for payment source | Admitting: Physician Assistant

## 2022-01-08 DIAGNOSIS — B379 Candidiasis, unspecified: Secondary | ICD-10-CM | POA: Diagnosis not present

## 2022-01-08 DIAGNOSIS — T3695XA Adverse effect of unspecified systemic antibiotic, initial encounter: Secondary | ICD-10-CM

## 2022-01-08 LAB — CULTURE, GROUP A STREP (THRC)

## 2022-01-08 MED ORDER — FLUCONAZOLE 150 MG PO TABS
150.0000 mg | ORAL_TABLET | Freq: Once | ORAL | 0 refills | Status: AC
  Filled 2022-01-08: qty 1, 1d supply, fill #0

## 2022-01-08 NOTE — Progress Notes (Signed)

## 2022-01-08 NOTE — Progress Notes (Signed)
I have spent 5 minutes in review of e-visit questionnaire, review and updating patient chart, medical decision making and response to patient.   Isaid Salvia Cody Diesel Lina, PA-C    

## 2022-01-16 ENCOUNTER — Telehealth: Payer: Self-pay | Admitting: Family Medicine

## 2022-01-16 NOTE — Telephone Encounter (Signed)
Lauren has approved 8am slot override for 02/08/2022 if Dr. Mardelle Matte agrees for [facility]

## 2022-01-17 NOTE — Telephone Encounter (Signed)
Pt called back to check on this and I let her know that we are just waiting on approval from Dr Mardelle Matte and that we will give her a call back when we know

## 2022-01-29 ENCOUNTER — Other Ambulatory Visit (HOSPITAL_COMMUNITY): Payer: Self-pay

## 2022-01-29 MED ORDER — PREVIDENT 5000 BOOSTER PLUS 1.1 % DT PSTE
PASTE | DENTAL | 2 refills | Status: DC
  Filled 2022-01-29: qty 100, 30d supply, fill #0

## 2022-01-31 ENCOUNTER — Ambulatory Visit
Admission: EM | Admit: 2022-01-31 | Discharge: 2022-01-31 | Disposition: A | Discharge status: Home / Self Care | Payer: No Typology Code available for payment source | Attending: Emergency Medicine | Admitting: Emergency Medicine

## 2022-01-31 ENCOUNTER — Other Ambulatory Visit: Payer: Self-pay

## 2022-01-31 ENCOUNTER — Other Ambulatory Visit (HOSPITAL_COMMUNITY): Payer: Self-pay

## 2022-01-31 DIAGNOSIS — J069 Acute upper respiratory infection, unspecified: Secondary | ICD-10-CM | POA: Insufficient documentation

## 2022-01-31 DIAGNOSIS — Z9114 Patient's other noncompliance with medication regimen: Secondary | ICD-10-CM | POA: Diagnosis present

## 2022-01-31 DIAGNOSIS — Z8709 Personal history of other diseases of the respiratory system: Secondary | ICD-10-CM | POA: Insufficient documentation

## 2022-01-31 DIAGNOSIS — T3696XA Underdosing of unspecified systemic antibiotic, initial encounter: Secondary | ICD-10-CM | POA: Insufficient documentation

## 2022-01-31 LAB — POCT RAPID STREP A (OFFICE): Rapid Strep A Screen: NEGATIVE

## 2022-01-31 MED ORDER — FLUCONAZOLE 150 MG PO TABS
ORAL_TABLET | ORAL | 0 refills | Status: DC
  Filled 2022-01-31: qty 2, 3d supply, fill #0

## 2022-01-31 MED ORDER — CEFDINIR 300 MG PO CAPS
300.0000 mg | ORAL_CAPSULE | Freq: Two times a day (BID) | ORAL | 0 refills | Status: DC
  Filled 2022-01-31: qty 20, 10d supply, fill #0

## 2022-01-31 NOTE — ED Triage Notes (Signed)
Pt reports having sore throat, and headache, pt reports having throat drainage. She reports symptoms began flaring back up on monday.

## 2022-01-31 NOTE — Discharge Instructions (Signed)
Please begin cefdinir for resolution of bacterial infection in your upper respiratory tract.  Please continue Flonase and azelastine as prescribed.  I recommend that you add Zyrtec 10 mg daily to your allergy current regimen for best results.  I also recommend taking ibuprofen 400 mg 3 times daily for the next few days to reduce inflammation and allow ease of drainage.  Guaifenesin (Mucinex, plain Robitussin) is also effective at helping expectorate mucus from the sinus passages and lungs.  Thank you for visiting urgent care today.

## 2022-01-31 NOTE — ED Provider Notes (Signed)
[facility]    CSN: [id number] Arrival date & time: 01/31/22  1457    HISTORY   Chief Complaint  Patient presents with   Sore Throat   HPI Brittany Morrow is a 36 y.o. female. Patient complains of recurrent sore throat, nasal congestion and coughing up dark sputum.  Per EMR, patient was seen on January 6 at urgent care, diagnosed with otitis media and prescribed a 10-day course of Augmentin.  Rapid strep test at that visit was negative however throat culture was positive for moderate amount of strep A.  Patient states she is a Engineer, civil (consulting) with .  Patient states that after 4 days, she discontinued Augmentin because she was feeling better.  Patient states 3 days after discontinuing, she began to feel worse again so she resumed Augmentin for 7 more days, last dose was a little over a week ago.  Patient states she again felt a little better but now feels worse again.  Patient states she uses Flonase and azelastine nasal sprays every day, per EMR azelastine has not been filled since September 2022.  Not currently using any allergy medications.  Patient states a friend recommended that she use Mucinex D but she has not yet tried this.  The history is provided by the patient.  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   Hypothyroidism    Irritable bowel syndrome with both constipation and diarrhea 07/06/2020   Vitamin D deficiency    Patient Active Problem List   Diagnosis Date Noted   Vitamin B12 deficiency 07/06/2020   Irritable bowel syndrome with both constipation and diarrhea 07/06/2020   Oral contraceptive use 07/06/2020   Obesity (BMI 30.0-34.9) 03/11/2017   Acquired hypothyroidism 12/27/2015   History of cervical dysplasia 07/31/2012   Past Surgical History:  Procedure Laterality Date    CHOLECYSTECTOMY     LASER VAPORIZATION OF CERVIX AND VAGINA  2008   [facility]--DR FERNANDEZ   REDUCTION MAMMAPLASTY     OB History     Gravida  1   Para  1   Term      Preterm      AB      Living  1      SAB      IAB      Ectopic      Multiple      Live Births             Home Medications    Prior to Admission medications   Medication Sig Start Date End Date Taking? Authorizing Provider  azelastine (ASTELIN) 0.1 % nasal spray Place 1 spray into both nostrils 2 (two) times daily. 05/10/21   Marcelyn Bruins, MD  Cholecalciferol (VITAMIN D3 PO) Take by mouth daily.    [provider]  clobetasol ointment (TEMOVATE) 0.05 % Can apply to eczema on hands twice daily as needed during severe flares.  Do not use for longer than two weeks in a row. 04/27/21   Padgett, Pilar Grammes, MD  fluticasone [facility]) 50 MCG/ACT nasal spray Place 1 spray into both nostrils in the morning and at bedtime. 05/10/21   Marcelyn Bruins, MD  ibuprofen (ADVIL) 100 MG tablet Take 200 mg by mouth every 6 (six) hours as needed for fever.  [provider]  levothyroxine (SYNTHROID) 88 MCG tablet Take 1 tablet (88 mcg total) by mouth daily. 11/21/21 11/21/22  Leamon Arnt, MD  Multiple Vitamin (MULTIVITAMIN) tablet Take 1 tablet by mouth daily.    [provider]  norethindrone-ethinyl estradiol-FE (JUNEL FE 1/20) 1-20 MG-MCG tablet Take 1 tablet by mouth every day 10/08/21     PREVIDENT 5000 BOOSTER PLUS 1.1 % PSTE Brush a pea sized amount on teeth for 2 to 3 minutes. Spit out excess and leave on overnight. Do not rinse off. Do not swallow. Use 3 to 4 times a week. 01/29/22     Semaglutide,0.25 or 0.5MG /DOS, (OZEMPIC, 0.25 OR 0.5 MG/DOSE,) 2 MG/1.5ML SOPN Inject 0.25 mg into the skin once a week for 28 days, THEN 0.5 mg once a week. 11/20/21 02/18/22  Leamon Arnt, MD  triamcinolone ointment (KENALOG) 0.1 % Use sparingly twice a day as needed to red  itchy areas.  (Do not use on face, neck, groin, or armpit region) 07/16/21   Althea Charon, FNP   Family History Family History  Problem Relation Age of Onset   Diabetes Father    Alcohol abuse Father    Diabetes Paternal Grandmother    Hypertension Mother    Healthy Daughter    Social History Social History   Tobacco Use   Smoking status: Never   Smokeless tobacco: Never  Substance Use Topics   Alcohol use: Yes    Comment: RARE-SOCIALLY ONLY   Drug use: No   Allergies   Sulfa antibiotics and Tramadol  Review of Systems Review of Systems Pertinent findings noted in history of present illness.   Physical Exam Triage Vital Signs ED Triage Vitals  Enc Vitals Group     BP 10/26/21 0827 (!) 147/82     Pulse Rate 10/26/21 0827 72     Resp 10/26/21 0827 18     Temp 10/26/21 0827 98.3 F (36.8 C)     Temp Source 10/26/21 0827 Oral     SpO2 10/26/21 0827 98 %     Weight --      Height --      Head Circumference --      Peak Flow --      Pain Score 10/26/21 0826 5     Pain Loc --      Pain Edu? --      Excl. in [place]? --   No data found.  Updated Vital Signs BP 115/79 (BP Location: Right Arm)    Pulse 92    Temp 98.8 F (37.1 C) (Oral)    Resp 18    LMP 01/19/2022    SpO2 96%   Physical Exam Vitals and nursing note reviewed.  Constitutional:      General: She is awake. She is not in acute distress.    Appearance: Normal appearance. She is well-developed. She is obese. She is not ill-appearing or toxic-appearing.  HENT:     Head: Normocephalic and atraumatic.     Salivary Glands: Right salivary gland is diffusely enlarged and tender. Left salivary gland is diffusely enlarged and tender.     Right Ear: External ear normal. No drainage. A middle ear effusion is present. There is no impacted cerumen. Tympanic membrane is bulging. Tympanic membrane is not injected or erythematous.     Left Ear: External ear normal. No drainage. A middle ear effusion is present. There is  no impacted cerumen. Tympanic membrane is bulging. Tympanic membrane is not injected or  erythematous.     Ears:     Comments: Bilateral EACs with mild erythema at base, both TMs bulging with clear fluid    Nose: Mucosal edema, congestion and rhinorrhea present. No nasal deformity, septal deviation, signs of injury or nasal tenderness. Rhinorrhea is clear.     Right Nostril: Occlusion present. No foreign body, epistaxis or septal hematoma.     Left Nostril: Occlusion present. No foreign body, epistaxis or septal hematoma.     Right Turbinates: Enlarged and swollen (Erythematous). Not pale.     Left Turbinates: Enlarged and swollen (Erythematous). Not pale.     Right Sinus: No maxillary sinus tenderness or frontal sinus tenderness.     Left Sinus: No maxillary sinus tenderness or frontal sinus tenderness.     Mouth/Throat:     Lips: Pink. No lesions.     Mouth: Mucous membranes are moist. No oral lesions.     Pharynx: Oropharynx is clear. Uvula midline. Posterior oropharyngeal erythema present. No pharyngeal swelling or oropharyngeal exudate.     Tonsils: No tonsillar exudate. 1+ on the right. 1+ on the left.     Comments: Postnasal drip Eyes:     General: Lids are normal.        Right eye: No discharge.        Left eye: No discharge.     Extraocular Movements: Extraocular movements intact.     Conjunctiva/sclera: Conjunctivae normal.     Right eye: Right conjunctiva is not injected.     Left eye: Left conjunctiva is not injected.     Pupils: Pupils are equal, round, and reactive to light.  Neck:     Thyroid: No thyroid mass.     Trachea: Trachea and phonation normal.  Cardiovascular:     Rate and Rhythm: Normal rate and regular rhythm.     Pulses: Normal pulses.     Heart sounds: Normal heart sounds. No murmur heard.   No friction rub. No gallop.  Pulmonary:     Effort: Pulmonary effort is normal. No accessory muscle usage, prolonged expiration or respiratory distress.     Breath  sounds: Normal breath sounds. No stridor, decreased air movement or transmitted upper airway sounds. No decreased breath sounds, wheezing, rhonchi or rales.  Chest:     Chest wall: No tenderness.  Musculoskeletal:        General: Normal range of motion.     Cervical back: Full passive range of motion without pain, normal range of motion and neck supple. No edema or erythema. No pain with movement. Normal range of motion.  Lymphadenopathy:     Cervical: Cervical adenopathy present.     Right cervical: Superficial cervical adenopathy present. No deep or posterior cervical adenopathy.    Left cervical: Superficial cervical adenopathy present. No deep or posterior cervical adenopathy.  Skin:    General: Skin is warm and dry.     Findings: No erythema or rash.  Neurological:     General: No focal deficit present.     Mental Status: She is alert and oriented to person, place, and time.  Psychiatric:        Mood and Affect: Mood normal.        Behavior: Behavior normal. Behavior is cooperative.    Visual Acuity Right Eye Distance:   Left Eye Distance:   Bilateral Distance:    Right Eye Near:   Left Eye Near:    Bilateral Near:     UC Couse / Diagnostics /  Procedures:    EKG  Radiology No results found.  Procedures Procedures (including critical care time)  UC Diagnoses / Final Clinical Impressions(s)   I have reviewed the triage vital signs and the nursing notes.  Pertinent labs & imaging results that were available during my care of the patient were reviewed by me and considered in my medical decision making (see chart for details).   Final diagnoses:  Upper respiratory tract infection, unspecified type  Noncompliance with medications  Underdosing of systemic antibiotic, initial encounter  History of streptococcal pharyngitis   Patient educated re: importance of finishing all antibiotics as prescribed. Patient provided with a prescription for cefdinir for 10 days.  Patient  advised to take as prescribed.  Recommend adding Zyrtec and ibuprofen to current allergy regimen.  Return precautions advised.  Note provided for work.  ED Prescriptions     Medication Sig Dispense Auth. Provider   cefdinir (OMNICEF) 300 MG capsule Take 1 capsule (300 mg total) by mouth 2 (two) times daily for 10 days. 20 capsule Lynden Oxford Scales, PA-C   fluconazole (DIFLUCAN) 150 MG tablet Take 1 tablet on day 4 of antibiotics.  Take second tablet 3 days later. 2 tablet Lynden Oxford Scales, PA-C      PDMP not reviewed this encounter.  Pending results:  Labs Reviewed  CULTURE, GROUP A STREP [facility])  POCT RAPID STREP A (OFFICE)    Medications Ordered in UC: Medications - No data to display  Disposition Upon Discharge:  Condition: stable for discharge home Home: take medications as prescribed; routine discharge instructions as discussed; follow up as advised.  Patient presented with an acute illness with associated systemic symptoms and significant discomfort requiring urgent management. In my opinion, this is a condition that a prudent lay person (someone who possesses an average knowledge of health and medicine) may potentially expect to result in complications if not addressed urgently such as respiratory distress, impairment of bodily function or dysfunction of bodily organs.   Routine symptom specific, illness specific and/or disease specific instructions were discussed with the patient and/or caregiver at length.   As such, the patient has been evaluated and assessed, work-up was performed and treatment was provided in alignment with urgent care protocols and evidence based medicine.  Patient/parent/caregiver has been advised that the patient may require follow up for further testing and treatment if the symptoms continue in spite of treatment, as clinically indicated and appropriate.  If the patient was tested for COVID-19, Influenza and/or RSV, then the  patient/parent/guardian was advised to isolate at home pending the results of his/her diagnostic coronavirus test and potentially longer if theyre positive. I have also advised pt that if his/her COVID-19 test returns positive, it's recommended to self-isolate for at least 10 days after symptoms first appeared AND until fever-free for 24 hours without fever reducer AND other symptoms have improved or resolved. Discussed self-isolation recommendations as well as instructions for household member/close contacts as per the [facility] and [place] DHHS, and also gave patient the [facility] packet with this information.  Patient/parent/caregiver has been advised to return to the [facility] or PCP in 3-5 days if no better; to PCP or the Emergency Department if new signs and symptoms develop, or if the current signs or symptoms continue to change or worsen for further workup, evaluation and treatment as clinically indicated and appropriate  The patient will follow up with their current PCP if and as advised. If the patient does not currently have a PCP we will assist  them in obtaining one.   The patient may need specialty follow up if the symptoms continue, in spite of conservative treatment and management, for further workup, evaluation, consultation and treatment as clinically indicated and appropriate.  Patient/parent/caregiver verbalized understanding and agreement of plan as discussed.  All questions were addressed during visit.  Please see discharge instructions below for further details of plan.  Discharge Instructions:   Discharge Instructions      Please begin cefdinir for resolution of bacterial infection in your upper respiratory tract.  Please continue Flonase and azelastine as prescribed.  I recommend that you add Zyrtec 10 mg daily to your allergy current regimen for best results.  I also recommend taking ibuprofen 400 mg 3 times daily for the next few days to reduce inflammation and allow ease of drainage.   Guaifenesin (Mucinex, plain Robitussin) is also effective at helping expectorate mucus from the sinus passages and lungs.  Thank you for visiting urgent care today.      This office note has been dictated using Museum/gallery curator.  Unfortunately, and despite my best efforts, this method of dictation can sometimes lead to occasional typographical or grammatical errors.  I apologize in advance if this occurs.     Lynden Oxford Scales, PA-C 01/31/22 1902

## 2022-02-04 LAB — CULTURE, GROUP A STREP (THRC)

## 2022-02-05 ENCOUNTER — Other Ambulatory Visit (HOSPITAL_COMMUNITY): Payer: Self-pay

## 2022-02-06 ENCOUNTER — Other Ambulatory Visit (HOSPITAL_COMMUNITY): Payer: Self-pay

## 2022-02-08 ENCOUNTER — Other Ambulatory Visit (HOSPITAL_COMMUNITY): Payer: Self-pay

## 2022-02-08 ENCOUNTER — Ambulatory Visit (INDEPENDENT_AMBULATORY_CARE_PROVIDER_SITE_OTHER): Payer: No Typology Code available for payment source | Admitting: Nurse Practitioner

## 2022-02-08 ENCOUNTER — Other Ambulatory Visit: Payer: Self-pay

## 2022-02-08 ENCOUNTER — Encounter: Payer: Self-pay | Admitting: Nurse Practitioner

## 2022-02-08 VITALS — BP 110/84 | HR 97 | Temp 96.6°F | Ht 63.0 in | Wt 183.6 lb

## 2022-02-08 DIAGNOSIS — J029 Acute pharyngitis, unspecified: Secondary | ICD-10-CM

## 2022-02-08 DIAGNOSIS — E669 Obesity, unspecified: Secondary | ICD-10-CM | POA: Diagnosis not present

## 2022-02-08 DIAGNOSIS — E039 Hypothyroidism, unspecified: Secondary | ICD-10-CM | POA: Diagnosis not present

## 2022-02-08 DIAGNOSIS — E66811 Obesity, class 1: Secondary | ICD-10-CM

## 2022-02-08 MED ORDER — FLUCONAZOLE 150 MG PO TABS
ORAL_TABLET | ORAL | 0 refills | Status: DC
  Filled 2022-02-08: qty 2, 3d supply, fill #0

## 2022-02-08 MED ORDER — SEMAGLUTIDE (1 MG/DOSE) 4 MG/3ML ~~LOC~~ SOPN
1.0000 mg | PEN_INJECTOR | SUBCUTANEOUS | 1 refills | Status: DC
  Filled 2022-02-08: qty 3, 28d supply, fill #0
  Filled 2022-03-20: qty 3, 28d supply, fill #1

## 2022-02-08 MED ORDER — PENICILLIN V POTASSIUM 500 MG PO TABS
500.0000 mg | ORAL_TABLET | Freq: Three times a day (TID) | ORAL | 0 refills | Status: AC
  Filled 2022-02-08: qty 30, 10d supply, fill #0

## 2022-02-08 NOTE — Progress Notes (Signed)
Established Patient Office Visit  Subjective:  Patient ID: Brittany Morrow, female    DOB: 1986/08/18  Age: 36 y.o. MRN: [id number]  CC:  Chief Complaint  Patient presents with   Transitions Of Care    TOC. Est care. Pt c/o chronic sore throat 2-4wks w/ ear pain.     HPI [name] presents to transfer care to a new provider.  Introduced to Publishing rights manager role and practice setting.  All questions answered.  Discussed provider/patient relationship and expectations.  She has been experiencing a sore throat that started 01/04/22. She went to urgent care and was prescribed amoxicillin, however she didn't take full course of antibiotics because she started feeling better. Her sore throat worsened went back to urgent care on 01/31/22 and was prescribed cefdinir. She is still taking these antibiotics, but only has 2 more days left and symptoms worsening. She endorses pressure in both ears, nasal congestion, and post nasal drip. Denies cough and fever. She has also been using flonase, azelastine, and theraflu   She was started on ozempic by last provider back in November 2022. She was on 0.25mg  and then has been on 0.5mg  for the last 4 weeks. She does not have any side effects with the medication. She has lost 6 pounds since her last visit. She is working out routinely. She was expecting more weight loss than what she has lost.   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   Hypothyroidism    Irritable bowel syndrome with both constipation and diarrhea 07/06/2020   Vitamin D deficiency     Past Surgical History:  Procedure Laterality Date   ABDOMINOPLASTY     2018   CHOLECYSTECTOMY     LASER VAPORIZATION OF CERVIX AND VAGINA  2008   [facility]--DR FERNANDEZ   REDUCTION MAMMAPLASTY      Family History  Problem  Relation Age of Onset   Diabetes Father    Alcohol abuse Father    Diabetes Paternal Grandmother    Hypertension Mother    Healthy Daughter     Social History   Socioeconomic History   Marital status: Single    Spouse name: Not on file   Number of children: 1   Years of education: College   Highest education level: Not on file  Occupational History   Occupation: Teacher, 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[employer]    Comment: Neurolgy  Tobacco Use   Smoking status: Never   Smokeless tobacco: Never  Vaping Use   Vaping Use: Never used  Substance and Sexual Activity   Alcohol use: Yes    Comment: RARE-SOCIALLY ONLY   Drug use: No   Sexual activity: Yes    Birth control/protection: Pill  Other Topics Concern   Not on file  Social History Narrative   Not on file   Social Determinants of Health   Financial Resource Strain: Not on file  Food Insecurity: Not on file  Transportation Needs: Not on file  Physical Activity: Not on file  Stress: Not on file  Social Connections: Not on file  Intimate Partner Violence: Not on file    Outpatient Medications Prior to Visit  Medication Sig Dispense Refill   azelastine (ASTELIN) 0.1 % nasal spray Place 1 spray into both nostrils 2 (  two) times daily. 30 mL 5   Cholecalciferol (VITAMIN D3 PO) Take by mouth daily.     clobetasol ointment (TEMOVATE) 0.05 % Can apply to eczema on hands twice daily as needed during severe flares.  Do not use for longer than two weeks in a row. 30 g 3   fluticasone (FLONASE) 50 MCG/ACT nasal spray Place 1 spray into both nostrils in the morning and at bedtime. 16 g 5   ibuprofen (ADVIL) 100 MG tablet Take 200 mg by mouth every 6 (six) hours as needed for fever.     levothyroxine (SYNTHROID) 88 MCG tablet Take 1 tablet (88 mcg total) by mouth daily. 90 tablet 3   Multiple Vitamin (MULTIVITAMIN) tablet Take 1 tablet by mouth daily.     norethindrone-ethinyl estradiol-FE (JUNEL FE 1/20) 1-20 MG-MCG tablet Take 1 tablet by mouth  every day 84 tablet 2   PREVIDENT 5000 BOOSTER PLUS 1.1 % PSTE Brush a pea sized amount on teeth for 2 to 3 minutes. Spit out excess and leave on overnight. Do not rinse off. Do not swallow. Use 3 to 4 times a week. 100 mL 2   triamcinolone ointment (KENALOG) 0.1 % Use sparingly twice a day as needed to red itchy areas.  (Do not use on face, neck, groin, or armpit region) 80 g 1   cefdinir (OMNICEF) 300 MG capsule Take 1 capsule (300 mg total) by mouth 2 (two) times daily for 10 days. 20 capsule 0   fluconazole (DIFLUCAN) 150 MG tablet Take 1 tablet on day 4 of antibiotics.  Take second tablet 3 days later. 2 tablet 0   Semaglutide,0.25 or 0.5MG /DOS, (OZEMPIC, 0.25 OR 0.5 MG/DOSE,) 2 MG/1.5ML SOPN Inject 0.25 mg into the skin once a week for 28 days, THEN 0.5 mg once a week. 4.5 mL 1   No facility-administered medications prior to visit.    Allergies  Allergen Reactions   Sulfa Antibiotics Palpitations   Tramadol Itching    ROS Review of Systems  Constitutional: Negative.   HENT:  Positive for congestion, ear pain, postnasal drip, sinus pressure and sore throat.   Eyes: Negative.   Respiratory: Negative.    Cardiovascular: Negative.   Gastrointestinal: Negative.   Genitourinary: Negative.   Musculoskeletal: Negative.   Skin: Negative.   Neurological:  Positive for headaches. Negative for dizziness.  Psychiatric/Behavioral: Negative.       Objective:    Physical Exam Vitals and nursing note reviewed.  Constitutional:      General: She is not in acute distress.    Appearance: Normal appearance.  HENT:     Head: Normocephalic and atraumatic.     Right Ear: Tympanic membrane, ear canal and external ear normal.     Left Ear: Tympanic membrane, ear canal and external ear normal.  Eyes:     Conjunctiva/sclera: Conjunctivae normal.  Cardiovascular:     Rate and Rhythm: Normal rate and regular rhythm.     Pulses: Normal pulses.     Heart sounds: Normal heart sounds.  Pulmonary:      Effort: Pulmonary effort is normal.     Breath sounds: Normal breath sounds.  Abdominal:     Palpations: Abdomen is soft.     Tenderness: There is no abdominal tenderness.  Musculoskeletal:     Cervical back: Normal range of motion.  Skin:    General: Skin is warm and dry.  Neurological:     General: No focal deficit present.     Mental Status:  She is alert and oriented to person, place, and time.  Psychiatric:        Mood and Affect: Mood normal.        Behavior: Behavior normal.        Thought Content: Thought content normal.        Judgment: Judgment normal.    BP 110/84    Pulse 97    Temp (!) 96.6 F (35.9 C) (Temporal)    Ht 5\' 3"  (1.6 m)    Wt 183 lb 9.6 oz (83.3 kg)    LMP 01/19/2022    SpO2 98%    BMI 32.52 kg/m  Wt Readings from Last 3 Encounters:  02/08/22 183 lb 9.6 oz (83.3 kg)  11/20/21 189 lb 9.6 oz (86 kg)  09/10/21 189 lb (85.7 kg)     There are no preventive care reminders to display for this patient.  There are no preventive care reminders to display for this patient.  Lab Results  Component Value Date   TSH 1.96 11/20/2021   Lab Results  Component Value Date   WBC 7.6 11/20/2021   HGB 12.8 11/20/2021   HCT 39.7 11/20/2021   MCV 86.3 11/20/2021   PLT 246.0 11/20/2021   Lab Results  Component Value Date   NA 138 11/20/2021   K 4.1 11/20/2021   CO2 26 11/20/2021   GLUCOSE 88 11/20/2021   BUN 9 11/20/2021   CREATININE 0.66 11/20/2021   BILITOT 0.7 11/20/2021   ALKPHOS 70 11/20/2021   AST 22 11/20/2021   ALT 26 11/20/2021   PROT 7.3 11/20/2021   ALBUMIN 4.4 11/20/2021   CALCIUM 8.9 11/20/2021   GFR 113.91 11/20/2021   Lab Results  Component Value Date   CHOL 182 11/20/2021   Lab Results  Component Value Date   HDL 65.30 11/20/2021   Lab Results  Component Value Date   LDLCALC 101 (H) 11/20/2021   Lab Results  Component Value Date   TRIG 77.0 11/20/2021   Lab Results  Component Value Date   CHOLHDL 3 11/20/2021   Lab  Results  Component Value Date   HGBA1C 5.6 11/20/2021      Assessment & Plan:   Problem List Items Addressed This Visit       Endocrine   Acquired hypothyroidism - Primary    Chronic, stable. Last TSH in normal range in November 2022, continue current dose of levothyroxine. Call if any refill are needed.         Other   Obesity (BMI 30.0-34.9)    BMI 32 today. She has lost 6 pounds since her last visit. Congratulated her on this! Will increase ozempic today to 1mg  weekly. Continue with dietary and exercise changes. Follow up in  6-8 weeks.       Sore throat    Ongoing sore throat that had started in January. She was placed on augmentin and didn't finish the prescription because she felt better. Her symptoms worsened again and she went back to urgent care. She was started on cefdinir, however she is still having ongoing symptoms. Both times the rapid strep was negative, but culture grew group B strep. Her symptoms are worsening again. Will stop cefdinir and start on penicillin 3 times a day for 10 days. If symptoms still persistent after this course of antibiotics, will refer to ENT.        Meds ordered this encounter  Medications   Semaglutide, 1 MG/DOSE, 4 MG/3ML SOPN    Sig:  Inject 1 mg as directed once a week.    Dispense:  3 mL    Refill:  1   fluconazole (DIFLUCAN) 150 MG tablet    Sig: Take 1 tablet on day 4 of antibiotics.  Take second tablet 3 days later.    Dispense:  2 tablet    Refill:  0   penicillin v potassium (VEETID) 500 MG tablet    Sig: Take 1 tablet (500 mg total) by mouth 3 (three) times daily for 10 days.    Dispense:  30 tablet    Refill:  0    Follow-up: Return in about 6 weeks (around 03/22/2022) for 6-8 weeks, weight.    Gerre ScullLauren A Margaruite Top, NP

## 2022-02-08 NOTE — Assessment & Plan Note (Signed)
Chronic, stable. Last TSH in normal range in November 2022, continue current dose of levothyroxine. Call if any refill are needed.

## 2022-02-08 NOTE — Assessment & Plan Note (Signed)
Ongoing sore throat that had started in January. She was placed on augmentin and didn't finish the prescription because she felt better. Her symptoms worsened again and she went back to urgent care. She was started on cefdinir, however she is still having ongoing symptoms. Both times the rapid strep was negative, but culture grew group B strep. Her symptoms are worsening again. Will stop cefdinir and start on penicillin 3 times a day for 10 days. If symptoms still persistent after this course of antibiotics, will refer to ENT.

## 2022-02-08 NOTE — Patient Instructions (Signed)
It was great to see you!  Stop cefdinir. Start penicillin V, 500mg  three times a day for 10 days. I have also sent in 2 diflucan pills for you, one on day 4 of the antibiotic and another 3 days later. If your symptoms are still going on, send me a message or call and I will put in a referral to ENT.   I have increased your ozempic to 1mg  once a week. You may experience nausea or constipation with increasing the dose.   Let's follow-up in 1-2 months, sooner if you have concerns.  If a referral was placed today, you will be contacted for an appointment. Please note that routine referrals can sometimes take up to 3-4 weeks to process. Please call our office if you haven't heard anything after this time frame.  Take care,  , NP

## 2022-02-08 NOTE — Assessment & Plan Note (Signed)
BMI 32 today. She has lost 6 pounds since her last visit. Congratulated her on this! Will increase ozempic today to 1mg  weekly. Continue with dietary and exercise changes. Follow up in  6-8 weeks.

## 2022-02-20 ENCOUNTER — Ambulatory Visit: Payer: No Typology Code available for payment source | Admitting: Family Medicine

## 2022-03-21 ENCOUNTER — Other Ambulatory Visit (HOSPITAL_COMMUNITY): Payer: Self-pay

## 2022-04-01 NOTE — Progress Notes (Addendum)
? ?Established Patient Office Visit ? ?Subjective:  ?Patient ID: Brittany Morrow, female    DOB: [birth year]  Age: 36 y.o. MRN: [id number] ? ?CC:  ?Chief Complaint  ?Patient presents with  ?? Follow-up  ?  6 wk f/u. Weight management.   ? ? ?HPI ?Brittany Morrow presents for follow-up on weight loss. Her semaglutide was increased to 1mg  weekly last visit. She has noticed a decrease in her appetite since increasing her dose. She has decreased her snacking.  She has lost 3 pounds since her last visit.  She denies any side effects to the medication including nausea, vomiting, constipation, abdominal pain. ? ?She also notes that she was bit by her dog on her right index finger on Friday.  She states that the dog is up-to-date on all its vaccines.  She has been washing the area with soap and water and using a triple antibiotic ointment on it.  She endorses some swelling.  Denies drainage, fevers. ? ?Past Medical History:  ?Diagnosis Date  ?? Chlamydia 2002, 2005  ?? Dyshidrotic eczema   ?? Gonorrhea 2014  ?? History of cervical dysplasia 07/31/2012  ? 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 Hypothyroidism   ?? Irritable bowel syndrome with both constipation and diarrhea 07/06/2020  ?? Vitamin D deficiency   ? ? ?Past Surgical History:  ?Procedure Laterality Date  ?? ABDOMINOPLASTY    ? 2018  ?? CHOLECYSTECTOMY    ?? LASER VAPORIZATION OF CERVIX AND VAGINA  2008  ? [facility] FERNANDEZ  ?? REDUCTION MAMMAPLASTY    ? ? ?Family History  ?Problem Relation Age of Onset  ?? Diabetes Father   ?? Alcohol abuse Father   ?? Diabetes Paternal Grandmother   ?? Hypertension Mother   ?? Healthy Daughter   ? ? ?Social History  ? ?Socioeconomic History  ?? Marital status: Single  ?  Spouse name: Not on file  ?? Number of children: 1  ?? Years of education: College  ?? Highest education level: Not on file  ?Occupational History  ?? Occupation: Therapist, sports  ?  Employer: [employer]  ?  Comment: [employer]  ?Tobacco Use   ?? Smoking status: Never  ?? Smokeless tobacco: Never  ?Vaping Use  ?? Vaping Use: Never used  ?Substance and Sexual Activity  ?? Alcohol use: Yes  ?  Comment: RARE-SOCIALLY ONLY  ?? Drug use: No  ?? Sexual activity: Yes  ?  Birth control/protection: Pill  ?Other Topics Concern  ?? Not on file  ?Social History Narrative  ?? Not on file  ? ?Social Determinants of Health  ? ?Financial Resource Strain: Not on file  ?Food Insecurity: Not on file  ?Transportation Needs: Not on file  ?Physical Activity: Not on file  ?Stress: Not on file  ?Social Connections: Not on file  ?Intimate Partner Violence: Not on file  ? ? ?Outpatient Medications Prior to Visit  ?Medication Sig Dispense Refill  ?? azelastine (ASTELIN) 0.1 % nasal spray Place 1 spray into both nostrils 2 (two) times daily. 30 mL 5  ?? Cholecalciferol (VITAMIN D3 PO) Take by mouth daily.    ?? clobetasol ointment (TEMOVATE) 0.05 % Can apply to eczema on hands twice daily as needed during severe flares.  Do not use for longer than two weeks in a row. 30 g 3  ?? fluticasone (FLONASE) 50 MCG/ACT nasal spray Place 1 spray into both nostrils in the morning and at bedtime. 16 g  5  ?? ibuprofen (ADVIL) 100 MG tablet Take 200 mg by mouth every 6 (six) hours as needed for fever.    ?? levothyroxine (SYNTHROID) 88 MCG tablet Take 1 tablet (88 mcg total) by mouth daily. 90 tablet 3  ?? Multiple Vitamin (MULTIVITAMIN) tablet Take 1 tablet by mouth daily.    ?? norethindrone-ethinyl estradiol-FE (JUNEL FE 1/20) 1-20 MG-MCG tablet Take 1 tablet by mouth every day 84 tablet 2  ?? PREVIDENT 5000 BOOSTER PLUS 1.1 % PSTE Brush a pea sized amount on teeth for 2 to 3 minutes. Spit out excess and leave on overnight. Do not rinse off. Do not swallow. Use 3 to 4 times a week. 100 mL 2  ?? triamcinolone ointment (KENALOG) 0.1 % Use sparingly twice a day as needed to red itchy areas.  (Do not use on face, neck, groin, or armpit region) 80 g 1  ?? fluconazole (DIFLUCAN) 150 MG tablet Take  1 tablet on day 4 of antibiotics.  Take second tablet 3 days later. 2 tablet 0  ?? Semaglutide, 1 MG/DOSE, 4 MG/3ML SOPN Inject 1 mg as directed once a week. 3 mL 1  ? ?No facility-administered medications prior to visit.  ? ? ?Allergies  ?Allergen Reactions  ?? Sulfa Antibiotics Palpitations  ?? Tramadol Itching  ? ? ?ROS ?Review of Systems ?See pertinent positives and negatives per HPI. ?  ?Objective:  ?  ?Physical Exam ?Vitals and nursing note reviewed.  ?Constitutional:   ?   General: She is not in acute distress. ?   Appearance: Normal appearance.  ?HENT:  ?   Head: Normocephalic and atraumatic.  ?Eyes:  ?   Conjunctiva/sclera: Conjunctivae normal.  ?Cardiovascular:  ?   Rate and Rhythm: Normal rate.  ?   Pulses: Normal pulses.  ?Pulmonary:  ?   Effort: Pulmonary effort is normal.  ?Musculoskeletal:  ?   Cervical back: Normal range of motion.  ?Skin: ?   General: Skin is warm and dry.  ?   Comments: Scabbed abrasion to her right index finger, swelling, slight redness, no drainage  ?Neurological:  ?   General: No focal deficit present.  ?   Mental Status: She is alert and oriented to person, place, and time.  ?Psychiatric:     ?   Mood and Affect: Mood normal.     ?   Behavior: Behavior normal.     ?   Thought Content: Thought content normal.     ?   Judgment: Judgment normal.  ? ? ?BP 103/80   Pulse 85   Temp (!) 96.5 ?F (35.8 ?C) (Temporal)   Wt 180 lb 12.8 oz (82 kg)   SpO2 97%   BMI 32.03 kg/m?  ?Wt Readings from Last 3 Encounters:  ?04/04/22 180 lb 12.8 oz (82 kg)  ?02/08/22 183 lb 9.6 oz (83.3 kg)  ?11/20/21 189 lb 9.6 oz (86 kg)  ? ? ? ?Health Maintenance Due  ?Topic Date Due  ?? PAP SMEAR-Modifier  04/29/2022  ? ? ?There are no preventive care reminders to display for this patient. ? ?Lab Results  ?Component Value Date  ? TSH 1.96 11/20/2021  ? ?Lab Results  ?Component Value Date  ? WBC 7.6 11/20/2021  ? HGB 12.8 11/20/2021  ? HCT 39.7 11/20/2021  ? MCV 86.3 11/20/2021  ? PLT 246.0 11/20/2021   ? ?Lab Results  ?Component Value Date  ? NA 138 11/20/2021  ? K 4.1 11/20/2021  ? CO2 26 11/20/2021  ? GLUCOSE  88 11/20/2021  ? BUN 9 11/20/2021  ? CREATININE 0.66 11/20/2021  ? BILITOT 0.7 11/20/2021  ? ALKPHOS 70 11/20/2021  ? AST 22 11/20/2021  ? ALT 26 11/20/2021  ? PROT 7.3 11/20/2021  ? ALBUMIN 4.4 11/20/2021  ? CALCIUM 8.9 11/20/2021  ? GFR 113.91 11/20/2021  ? ?Lab Results  ?Component Value Date  ? CHOL 182 11/20/2021  ? ?Lab Results  ?Component Value Date  ? HDL 65.30 11/20/2021  ? ?Lab Results  ?Component Value Date  ? LDLCALC 101 (H) 11/20/2021  ? ?Lab Results  ?Component Value Date  ? TRIG 77.0 11/20/2021  ? ?Lab Results  ?Component Value Date  ? CHOLHDL 3 11/20/2021  ? ?Lab Results  ?Component Value Date  ? HGBA1C 5.6 11/20/2021  ? ? ?  ?Assessment & Plan:  ? ?Problem List Items Addressed This Visit   ? ?  ? Other  ? Obesity (BMI 30.0-34.9) - Primary  ?  She has lost 3 pound since her last visit, congratulated her on this!  She is not having any side effects from the increase of semaglutide, will increase semaglutide again to 2 mg weekly.  Continue focusing on diet and exercise.  Follow-up in 3 months. ?  ?  ? Dog bite  ?  She was bit by her dog on Friday, 6 days ago.  She states her dog is up-to-date on all vaccines.  She still has some swelling to her right index finger and some slight erythema.  We will start her on Augmentin 1 tablet twice a day for 5 days.  Continue washing the area with soap and water twice a day and using the antibiotic ointment.  We will update her Td booster today.  Follow-up if she notices more swelling, redness, drainage, fevers, any concerns. ?  ?  ? ?Other Visit Diagnoses   ? ? Laceration of skin of finger, initial encounter      ? See plan for dog bite  ? Relevant Orders  ? Td vaccine greater than or equal to 7yo preservative free IM (Completed)  ? Need for Td vaccine      ? Td booster updated with recent injury to finger  ? Relevant Orders  ? Td vaccine greater than or  equal to 7yo preservative free IM (Completed)  ? ?  ? ? ?Meds ordered this encounter  ?Medications  ?? amoxicillin-clavulanate (AUGMENTIN) 875-125 MG tablet  ?  Sig: Take 1 tablet by mouth 2  times daily.  ?  Dis

## 2022-04-04 ENCOUNTER — Ambulatory Visit (INDEPENDENT_AMBULATORY_CARE_PROVIDER_SITE_OTHER): Payer: No Typology Code available for payment source | Admitting: Nurse Practitioner

## 2022-04-04 ENCOUNTER — Encounter: Payer: Self-pay | Admitting: Nurse Practitioner

## 2022-04-04 ENCOUNTER — Other Ambulatory Visit (HOSPITAL_COMMUNITY): Payer: Self-pay

## 2022-04-04 VITALS — BP 103/80 | HR 85 | Temp 96.5°F | Wt 180.8 lb

## 2022-04-04 DIAGNOSIS — S61210A Laceration without foreign body of right index finger without damage to nail, initial encounter: Secondary | ICD-10-CM | POA: Diagnosis not present

## 2022-04-04 DIAGNOSIS — Z23 Encounter for immunization: Secondary | ICD-10-CM

## 2022-04-04 DIAGNOSIS — S61219A Laceration without foreign body of unspecified finger without damage to nail, initial encounter: Secondary | ICD-10-CM

## 2022-04-04 DIAGNOSIS — W540XXA Bitten by dog, initial encounter: Secondary | ICD-10-CM | POA: Diagnosis not present

## 2022-04-04 DIAGNOSIS — E669 Obesity, unspecified: Secondary | ICD-10-CM | POA: Diagnosis not present

## 2022-04-04 MED ORDER — FLUCONAZOLE 150 MG PO TABS
ORAL_TABLET | ORAL | 0 refills | Status: DC
  Filled 2022-04-04 – 2022-06-25 (×2): qty 2, 4d supply, fill #0

## 2022-04-04 MED ORDER — SEMAGLUTIDE (2 MG/DOSE) 8 MG/3ML ~~LOC~~ SOPN
2.0000 mg | PEN_INJECTOR | SUBCUTANEOUS | 1 refills | Status: DC
  Filled 2022-04-04: qty 9, 84d supply, fill #0

## 2022-04-04 MED ORDER — AMOXICILLIN-POT CLAVULANATE 875-125 MG PO TABS
1.0000 | ORAL_TABLET | Freq: Two times a day (BID) | ORAL | 0 refills | Status: DC
  Filled 2022-04-04: qty 10, 5d supply, fill #0

## 2022-04-04 NOTE — Patient Instructions (Signed)
It was great to see you! ? ?Increase your Ozempic to 2 mg once a week injection. ? ?Start Augmentin, antibiotic, twice a day for 5 days with food.  I have also sent some Diflucan for you to take after you finish the antibiotic.  Continue washing the area twice a day with soap and water.  You may use ice on it to help with any swelling.  Call if you develop any worsening redness, drainage, fevers. ? ?Let's follow-up in 3 months, sooner if you have concerns. ? ?If a referral was placed today, you will be contacted for an appointment. Please note that routine referrals can sometimes take up to 3-4 weeks to process. Please call our office if you haven't heard anything after this time frame. ? ?Take care, ? ?Rodman Pickle, NP ? ?

## 2022-04-04 NOTE — Assessment & Plan Note (Signed)
She has lost 3 pound since her last visit, congratulated her on this!  She is not having any side effects from the increase of semaglutide, will increase semaglutide again to 2 mg weekly.  Continue focusing on diet and exercise.  Follow-up in 3 months. ?

## 2022-04-04 NOTE — Assessment & Plan Note (Addendum)
She was bit by her dog on Friday, 6 days ago.  She states her dog is up-to-date on all vaccines.  She still has some swelling to her right index finger and some slight erythema.  We will start her on Augmentin 1 tablet twice a day for 5 days.  Continue washing the area with soap and water twice a day and using the antibiotic ointment.  We will update her Td booster today.  Follow-up if she notices more swelling, redness, drainage, fevers, any concerns. ?

## 2022-04-06 ENCOUNTER — Other Ambulatory Visit (HOSPITAL_COMMUNITY): Payer: Self-pay

## 2022-04-11 ENCOUNTER — Other Ambulatory Visit (HOSPITAL_COMMUNITY): Payer: Self-pay

## 2022-04-11 ENCOUNTER — Encounter: Payer: Self-pay | Admitting: Nurse Practitioner

## 2022-04-12 ENCOUNTER — Other Ambulatory Visit (HOSPITAL_COMMUNITY): Payer: Self-pay

## 2022-04-16 ENCOUNTER — Telehealth: Payer: Self-pay | Admitting: Nurse Practitioner

## 2022-04-16 NOTE — Telephone Encounter (Signed)
This message goes along with Pt's Message from 04/11/22. She is wanting to know if there is a script she can take that is comprobable to [facility], 2 MG/DOSE, 8 MG/3ML SOPN [id number]. If so she would like it sent to  ?[facility]  ?[address], [place] [address]  ?Phone:  [phone]  Fax:  [phone]  ?DEA #:  [id number] ?

## 2022-04-20 ENCOUNTER — Other Ambulatory Visit (HOSPITAL_COMMUNITY): Payer: Self-pay

## 2022-04-25 ENCOUNTER — Other Ambulatory Visit (HOSPITAL_COMMUNITY): Payer: Self-pay

## 2022-04-25 MED ORDER — WEGOVY 1.7 MG/0.75ML ~~LOC~~ SOAJ
1.7000 mg | SUBCUTANEOUS | 1 refills | Status: DC
Start: 2022-04-25 — End: 2022-06-11
  Filled 2022-04-25: qty 3, 28d supply, fill #0

## 2022-04-25 NOTE — Telephone Encounter (Signed)
Called [employer] and was inform that PA appeal form semaglutide was denied. Reason for denial ?-pt needs a type 2 diabetes ?-HAVING TRIED METFORMIN OR METFORMIN COMBO ?-Currently stable on medication. ?

## 2022-04-27 ENCOUNTER — Other Ambulatory Visit (HOSPITAL_COMMUNITY): Payer: Self-pay

## 2022-05-01 ENCOUNTER — Telehealth: Payer: Self-pay

## 2022-05-01 NOTE — Telephone Encounter (Addendum)
PA appeal denied for ozempic 8mg /68mL. Patient notified via telephone at 1700 on 05/01/22.  Sw, cma ?

## 2022-05-02 DIAGNOSIS — Z0289 Encounter for other administrative examinations: Secondary | ICD-10-CM

## 2022-05-07 ENCOUNTER — Other Ambulatory Visit (HOSPITAL_COMMUNITY): Payer: Self-pay

## 2022-05-13 ENCOUNTER — Other Ambulatory Visit (HOSPITAL_COMMUNITY): Payer: Self-pay

## 2022-05-13 MED ORDER — CONTRAVE 8-90 MG PO TB12
ORAL_TABLET | ORAL | 0 refills | Status: DC
  Filled 2022-05-13: qty 77, 30d supply, fill #0

## 2022-05-13 NOTE — Addendum Note (Signed)
Addended by: Rodman Pickle A on: 05/13/2022 03:06 PM ? ? Modules accepted: Orders ? ?

## 2022-05-15 ENCOUNTER — Other Ambulatory Visit (HOSPITAL_COMMUNITY): Payer: Self-pay

## 2022-05-15 NOTE — Progress Notes (Signed)
PA done, waiting for determination from insurance. Will f/u with patient once a decision has been made.  ? ?Key: [id number] ?

## 2022-05-17 ENCOUNTER — Telehealth: Payer: Self-pay

## 2022-05-17 ENCOUNTER — Other Ambulatory Visit (HOSPITAL_COMMUNITY): Payer: Self-pay

## 2022-05-17 NOTE — Telephone Encounter (Signed)
PA for [facility] 8-90mg  apporved from 05/16/22 - 06/15/22.  Patient/pharmacy notifieced VIA phone. Dm/cma

## 2022-05-18 ENCOUNTER — Other Ambulatory Visit (HOSPITAL_COMMUNITY): Payer: Self-pay

## 2022-05-30 ENCOUNTER — Other Ambulatory Visit (HOSPITAL_COMMUNITY): Payer: Self-pay

## 2022-06-11 ENCOUNTER — Encounter (INDEPENDENT_AMBULATORY_CARE_PROVIDER_SITE_OTHER): Payer: Self-pay | Admitting: Family Medicine

## 2022-06-11 ENCOUNTER — Ambulatory Visit (INDEPENDENT_AMBULATORY_CARE_PROVIDER_SITE_OTHER): Payer: No Typology Code available for payment source | Admitting: Family Medicine

## 2022-06-11 VITALS — BP 112/79 | HR 75 | Temp 98.2°F | Ht 63.0 in | Wt 181.0 lb

## 2022-06-11 DIAGNOSIS — E538 Deficiency of other specified B group vitamins: Secondary | ICD-10-CM

## 2022-06-11 DIAGNOSIS — R5383 Other fatigue: Secondary | ICD-10-CM | POA: Diagnosis not present

## 2022-06-11 DIAGNOSIS — E038 Other specified hypothyroidism: Secondary | ICD-10-CM | POA: Diagnosis not present

## 2022-06-11 DIAGNOSIS — E669 Obesity, unspecified: Secondary | ICD-10-CM

## 2022-06-11 DIAGNOSIS — E559 Vitamin D deficiency, unspecified: Secondary | ICD-10-CM | POA: Diagnosis not present

## 2022-06-11 DIAGNOSIS — E668 Other obesity: Secondary | ICD-10-CM

## 2022-06-11 DIAGNOSIS — J302 Other seasonal allergic rhinitis: Secondary | ICD-10-CM | POA: Diagnosis not present

## 2022-06-11 DIAGNOSIS — Z6832 Body mass index (BMI) 32.0-32.9, adult: Secondary | ICD-10-CM

## 2022-06-11 DIAGNOSIS — R0602 Shortness of breath: Secondary | ICD-10-CM

## 2022-06-11 DIAGNOSIS — Z1331 Encounter for screening for depression: Secondary | ICD-10-CM

## 2022-06-11 DIAGNOSIS — E66811 Obesity, class 1: Secondary | ICD-10-CM

## 2022-06-11 DIAGNOSIS — Z9189 Other specified personal risk factors, not elsewhere classified: Secondary | ICD-10-CM

## 2022-06-12 LAB — COMPREHENSIVE METABOLIC PANEL
ALT: 17 IU/L (ref 0–32)
AST: 20 IU/L (ref 0–40)
Albumin/Globulin Ratio: 1.5 (ref 1.2–2.2)
Albumin: 4.3 g/dL (ref 3.8–4.8)
Alkaline Phosphatase: 65 IU/L (ref 44–121)
BUN/Creatinine Ratio: 20 (ref 9–23)
BUN: 12 mg/dL (ref 6–20)
Bilirubin Total: 0.4 mg/dL (ref 0.0–1.2)
CO2: 20 mmol/L (ref 20–29)
Calcium: 8.6 mg/dL — ABNORMAL LOW (ref 8.7–10.2)
Chloride: 101 mmol/L (ref 96–106)
Creatinine, Ser: 0.61 mg/dL (ref 0.57–1.00)
Globulin, Total: 2.9 g/dL (ref 1.5–4.5)
Glucose: 87 mg/dL (ref 70–99)
Potassium: 4.3 mmol/L (ref 3.5–5.2)
Sodium: 137 mmol/L (ref 134–144)
Total Protein: 7.2 g/dL (ref 6.0–8.5)
eGFR: 119 mL/min/{1.73_m2} (ref >59)

## 2022-06-12 LAB — CBC WITH DIFFERENTIAL/PLATELET
Basophils Absolute: 0 10*3/uL (ref 0.0–0.2)
Basos: 0 %
EOS (ABSOLUTE): 0.2 10*3/uL (ref 0.0–0.4)
Eos: 2 %
Hematocrit: 39.7 % (ref 34.0–46.6)
Hemoglobin: 12.8 g/dL (ref 11.1–15.9)
Immature Grans (Abs): 0 10*3/uL (ref 0.0–0.1)
Immature Granulocytes: 0 %
Lymphocytes Absolute: 2.1 10*3/uL (ref 0.7–3.1)
Lymphs: 23 %
MCH: 27.3 pg (ref 26.6–33.0)
MCHC: 32.2 g/dL (ref 31.5–35.7)
MCV: 85 fL (ref 79–97)
Monocytes Absolute: 0.5 10*3/uL (ref 0.1–0.9)
Monocytes: 5 %
Neutrophils Absolute: 6.5 10*3/uL (ref 1.4–7.0)
Neutrophils: 70 %
Platelets: 269 10*3/uL (ref 150–450)
RBC: 4.69 x10E6/uL (ref 3.77–5.28)
RDW: 13.6 % (ref 11.7–15.4)
WBC: 9.3 10*3/uL (ref 3.4–10.8)

## 2022-06-12 LAB — HEMOGLOBIN A1C
Est. average glucose Bld gHb Est-mCnc: 111 mg/dL
Hgb A1c MFr Bld: 5.5 % (ref 4.8–5.6)

## 2022-06-12 LAB — VITAMIN D 25 HYDROXY (VIT D DEFICIENCY, FRACTURES): Vit D, 25-Hydroxy: 21.8 ng/mL — ABNORMAL LOW (ref 30.0–100.0)

## 2022-06-12 LAB — LIPID PANEL WITH LDL/HDL RATIO
Cholesterol, Total: 190 mg/dL (ref 100–199)
HDL: 67 mg/dL (ref >39)
LDL Chol Calc (NIH): 98 mg/dL (ref 0–99)
LDL/HDL Ratio: 1.5 ratio (ref 0.0–3.2)
Triglycerides: 143 mg/dL (ref 0–149)
VLDL Cholesterol Cal: 25 mg/dL (ref 5–40)

## 2022-06-12 LAB — TSH: TSH: 6.48 u[IU]/mL — ABNORMAL HIGH (ref 0.450–4.500)

## 2022-06-12 LAB — FOLATE: Folate: >20 ng/mL (ref >3.0)

## 2022-06-12 LAB — VITAMIN B12: Vitamin B-12: 355 pg/mL (ref 232–1245)

## 2022-06-12 LAB — T4, FREE: Free T4: 0.95 ng/dL (ref 0.82–1.77)

## 2022-06-12 LAB — INSULIN, RANDOM: INSULIN: 13.1 u[IU]/mL (ref 2.6–24.9)

## 2022-06-17 NOTE — Progress Notes (Signed)
Chief Complaint:   OBESITY ADALAYA IRION (MR# [id number]) is a 36 y.o. female who presents for evaluation and treatment of obesity and related comorbidities. Current BMI is Body mass index is 32.06 kg/m. Kenyona has been struggling with her weight for many years and has been unsuccessful in either losing weight, maintaining weight loss, or reaching her healthy weight goal.  Linsay is currently in the action stage of change and ready to dedicate time achieving and maintaining a healthier weight. Gabrielle is interested in becoming our patient and working on intensive lifestyle modifications including (but not limited to) diet and exercise for weight loss.  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 She lives with [place], 3 year old daughter, and 2 dogs. She reports that her life is so busy, she doesn't have time to eat or food prep. Pt's worst habit are carbs (bread and rice) and chips.  Houston's habits were reviewed today and are as follows: Her family eats meals together, she thinks her family will eat healthier with her, her desired weight loss is 31 lbs, her heaviest weight ever was 193 pounds, she has significant food cravings issues, she snacks frequently in the evenings, she skips meals frequently, she is frequently drinking liquids with calories, she frequently makes poor food choices, she frequently eats larger portions than normal, she has binge eating behaviors, and she struggles with emotional eating.  Depression Screen Jayonna's Food and Mood (modified PHQ-9) score was 7.     06/11/2022    7:25 AM  Depression screen PHQ 2/9  Decreased Interest 0  Down, Depressed, Hopeless 1  PHQ - 2 Score 1  Altered sleeping 1  Tired, decreased energy 3  Change in appetite 1  Feeling bad or failure about yourself  0  Trouble concentrating 0  Moving slowly or fidgety/restless 1  Suicidal thoughts 0  PHQ-9 Score 7  Difficult doing work/chores Not difficult at all   Subjective:    1. Other fatigue Javaeh admits to daytime somnolence and admits to waking up still tired. Patient has a history of symptoms of daytime fatigue and morning fatigue. Charm generally gets 5 or 6 hours of sleep per night, and states that she has poor sleep quality. Snoring is present. Apneic episodes are not present. Epworth Sleepiness Score is 15.   2. SOB (shortness of breath) on exertion Eugenie Norrie notes increasing shortness of breath with exercising and seems to be worsening over time with weight gain. She notes getting out of breath sooner with activity than she used to. This has gotten worse recently. Analis denies shortness of breath at rest or orthopnea.  3. B12 deficiency Pt reports daytime fatigue. Medication: OTC B12 500 mcg daily  4. Other specified hypothyroidism She is asymptomatic, with the exception of fatigue. Medication: Synthroid 88 mcg daily  5. Seasonal allergies Pt is managed by allergist, Dr. Delorse Lek. Medication: Astelin Nasal spray, Flonase  6. Vitamin D deficiency She is currently taking OTC vitamin D 1000 IU each day and multivitamin. She denies nausea, vomiting or muscle weakness.  7. At risk for impaired metabolic function Tashayla is at increased risk for impaired metabolic function due to hypothyroidism and obesity.  Assessment/Plan:   Orders Placed This Encounter  Procedures   Vitamin B12   CBC with Differential/Platelet   Comprehensive metabolic panel   Hemoglobin A1c   Insulin, random   Lipid Panel With LDL/HDL Ratio   T4, free   TSH  VITAMIN D 25 Hydroxy (Vit-D Deficiency, Fractures)   Folate   EKG 12-Lead    Medications Discontinued During This Encounter  Medication Reason   amoxicillin-clavulanate (AUGMENTIN) 875-125 MG tablet Patient Preference   Naltrexone-buPROPion HCl ER (CONTRAVE) 8-90 MG TB12 Patient Preference   Semaglutide-Weight Management (WEGOVY) 1.7 MG/0.75ML SOAJ Patient Preference     No orders of the defined types were placed in this  encounter.    1. Other fatigue Salita does feel that her weight is causing her energy to be lower than it should be. Fatigue may be related to obesity, depression or many other causes. Labs will be ordered, and in the meanwhile, Martiza will focus on self care including making healthy food choices, increasing physical activity and focusing on stress reduction. Obtain fasting labs today. ECG reviewed with pt. NSR, except acute findings. Pt is strongly encouraged to follow up with PCP for a possible OSA evaluation (pt not sure she will wear a mask and is not sure she wants evaluation).  - EKG 12-Lead - Comprehensive metabolic panel - Hemoglobin A1c - Insulin, random - Folate  2. SOB (shortness of breath) on exertion Berina does feel that she gets out of breath more easily that she used to when she exercises. Edita's shortness of breath appears to be obesity related and exercise induced. She has agreed to work on weight loss and gradually increase exercise to treat her exercise induced shortness of breath. Will continue to monitor closely.  3. B12 deficiency The diagnosis was reviewed with the patient. Counseling provided today, see below. We will continue to monitor. Orders and follow up as documented in patient record.  Counseling The body needs vitamin B12: to make red blood cells; to make DNA; and to help the nerves work properly so they can carry messages from the brain to the body.  The main causes of vitamin B12 deficiency include dietary deficiency, digestive diseases, pernicious anemia, and having a surgery in which part of the stomach or small intestine is removed.  Certain medicines can make it harder for the body to absorb vitamin B12. These medicines include: heartburn medications; some antibiotics; some medications used to treat diabetes, gout, and high cholesterol.  In some cases, there are no symptoms of this condition. If the condition leads to anemia or nerve damage, various symptoms can  occur, such as weakness or fatigue, shortness of breath, and numbness or tingling in your hands and feet.   Treatment:  May include taking vitamin B12 supplements.  Avoid alcohol.  Eat lots of healthy foods that contain vitamin B12: Beef, pork, chicken, [place], and organ meats, such as liver.  Seafood: This includes clams, rainbow trout, salmon, tuna, and haddock. Eggs.  Cereal and dairy products that are fortified: This means that vitamin B12 has been added to the food.  Obtain fasting labs today.  - Vitamin B12 - CBC with Differential/Platelet  4. Other specified hypothyroidism Patient with long-standing hypothyroidism, on levothyroxine therapy. She appears euthyroid. Orders and follow up as documented in patient record.  Counseling Good thyroid control is important for overall health. Supratherapeutic thyroid levels are dangerous and will not improve weight loss results. The correct way to take levothyroxine is fasting, with water, separated by at least 30 minutes from breakfast, and separated by more than 4 hours from calcium, iron, multivitamins, acid reflux medications (PPIs).  Obtain fasting labs today.  - Lipid Panel With LDL/HDL Ratio - T4, free - TSH  5. Seasonal allergies Symptoms well controlled. Continue current  treatment plan.  6. Vitamin D deficiency Low Vitamin D level contributes to fatigue and are associated with obesity, breast, and colon cancer. She agrees to continue to take OTC Vitamin D 1,000 IU and multivitamin daily and will follow-up for routine testing of Vitamin D, at least 2-3 times per year to avoid over-replacement. Obtain fasting labs today.  - VITAMIN D 25 Hydroxy (Vit-D Deficiency, Fractures)  7. Depression screening Samanthia had a positive depression screening. Depression is commonly associated with obesity and often results in emotional eating behaviors. We will monitor this closely and work on CBT to help improve the non-hunger eating patterns.  Referral to Psychology may be required if no improvement is seen as she continues in our clinic.  8. At risk for impaired metabolic function Due to Uzma's current state of health and medical condition(s), she is at a significantly higher risk for impaired metabolic function. At least 10 minutes was spent on counseling Darcie about these concerns today. This places the patient at a much greater risk to subsequently develop cardio-pulmonary conditions that can negatively affect the patient's quality of life. I stressed the importance of reversing these risks factors.  The initial goal is to lose at least 5-10% of starting weight to help reduce risk factors. Counseling: Intensive lifestyle modifications discussed with Milayah as the most appropriate first line treatment. She will continue to work on diet, exercise, and weight loss efforts. We will continue to reassess these conditions on a fairly regular basis in an attempt to decrease the patient's overall morbidity and mortality.  9. Class 1 obesity with serious comorbidity and body mass index (BMI) of 32.0 to 32.9 in adult, unspecified obesity type Juliyana is currently in the action stage of change and her goal is to continue with weight loss efforts. I recommend Yasna begin the structured treatment plan as follows:  She has agreed to the Category 1 Plan.  Exercise goals:  As is    Behavioral modification strategies: avoiding temptations and planning for success.  She was informed of the importance of frequent follow-up visits to maximize her success with intensive lifestyle modifications for her multiple health conditions. She was informed we would discuss her lab results at her next visit unless there is a critical issue that needs to be addressed sooner. Laverda Sorenson agreed to keep her next visit at the agreed upon time to discuss these results.  Objective:   Blood pressure 112/79, pulse 75, temperature 98.2 F (36.8 C), height 5\' 3"  (1.6 m), weight 181 lb  (82.1 kg), SpO2 95 %. Body mass index is 32.06 kg/m.  EKG: Normal sinus rhythm, rate 82 (Abnormal).  Indirect Calorimeter completed today shows a VO2 of 227 and a REE of 1570.  Her calculated basal metabolic rate is [id number] thus her basal metabolic rate is better than expected.  General: Cooperative, alert, well developed, in no acute distress. HEENT: Conjunctivae and lids unremarkable. Cardiovascular: Regular rhythm.  Lungs: Normal work of breathing. Neurologic: No focal deficits.   Lab Results  Component Value Date   CREATININE 0.61 06/11/2022   BUN 12 06/11/2022   NA 137 06/11/2022   K 4.3 06/11/2022   CL 101 06/11/2022   CO2 20 06/11/2022   Lab Results  Component Value Date   ALT 17 06/11/2022   AST 20 06/11/2022   ALKPHOS 65 06/11/2022   BILITOT 0.4 06/11/2022   Lab Results  Component Value Date   HGBA1C 5.5 06/11/2022   HGBA1C 5.6 11/20/2021   HGBA1C 5.4 07/06/2019  HGBA1C 5.4 07/23/2016   HGBA1C 5.3 08/06/2013   Lab Results  Component Value Date   INSULIN 13.1 06/11/2022   Lab Results  Component Value Date   TSH 6.480 (H) 06/11/2022   Lab Results  Component Value Date   CHOL 190 06/11/2022   HDL 67 06/11/2022   LDLCALC 98 06/11/2022   TRIG 143 06/11/2022   CHOLHDL 3 11/20/2021   Lab Results  Component Value Date   WBC 9.3 06/11/2022   HGB 12.8 06/11/2022   HCT 39.7 06/11/2022   MCV 85 06/11/2022   PLT 269 06/11/2022    Attestation Statements:   Reviewed by clinician on day of visit: allergies, medications, problem list, medical history, surgical history, family history, social history, and previous encounter notes.  I, Kyung Rudd, BS, CMA, am acting as transcriptionist for [employer], DO.  I have reviewed the above documentation for accuracy and completeness, and I agree with the above. Carlye Grippe, D.O.  The 21st Century Cures Act was signed into law in 2016 which includes the topic of electronic health records.  This  provides immediate access to information in MyChart.  This includes consultation notes, operative notes, office notes, lab results and pathology reports.  If you have any questions about what you read please let us know at your next visit so we can discuss your concerns and take corrective action if need be.  We are right here with you.

## 2022-06-25 ENCOUNTER — Ambulatory Visit (INDEPENDENT_AMBULATORY_CARE_PROVIDER_SITE_OTHER): Payer: No Typology Code available for payment source | Admitting: Family Medicine

## 2022-06-25 ENCOUNTER — Other Ambulatory Visit (HOSPITAL_COMMUNITY): Payer: Self-pay

## 2022-06-25 ENCOUNTER — Encounter (INDEPENDENT_AMBULATORY_CARE_PROVIDER_SITE_OTHER): Payer: Self-pay | Admitting: Family Medicine

## 2022-06-25 VITALS — BP 118/86 | HR 90 | Temp 98.0°F | Ht 63.0 in | Wt 182.0 lb

## 2022-06-25 DIAGNOSIS — E538 Deficiency of other specified B group vitamins: Secondary | ICD-10-CM

## 2022-06-25 DIAGNOSIS — E669 Obesity, unspecified: Secondary | ICD-10-CM

## 2022-06-25 DIAGNOSIS — E8881 Metabolic syndrome: Secondary | ICD-10-CM | POA: Diagnosis not present

## 2022-06-25 DIAGNOSIS — E038 Other specified hypothyroidism: Secondary | ICD-10-CM

## 2022-06-25 DIAGNOSIS — E559 Vitamin D deficiency, unspecified: Secondary | ICD-10-CM | POA: Diagnosis not present

## 2022-06-25 DIAGNOSIS — Z6832 Body mass index (BMI) 32.0-32.9, adult: Secondary | ICD-10-CM

## 2022-06-25 DIAGNOSIS — Z9189 Other specified personal risk factors, not elsewhere classified: Secondary | ICD-10-CM

## 2022-06-25 MED ORDER — VITAMIN B-12 1000 MCG PO TABS: ORAL_TABLET | ORAL | Status: DC

## 2022-06-25 MED ORDER — VITAMIN D (ERGOCALCIFEROL) 1.25 MG (50000 UNIT) PO CAPS
50000.0000 [IU] | ORAL_CAPSULE | ORAL | 0 refills | Status: DC
  Filled 2022-06-25: qty 4, 28d supply, fill #0

## 2022-06-26 NOTE — Progress Notes (Unsigned)
Chief Complaint:   OBESITY Brittany Morrow is here to discuss her progress with her obesity treatment plan along with follow-up of her obesity related diagnoses. Brittany Morrow is on the Category 1 Plan and states she is following her eating plan approximately 50% of the time. Brittany Morrow states she is walking more.  Today's visit was #: 2 Starting weight: 181 lbs Starting date: 06/11/2022 Today's weight: 182 lbs Today's date: 06/25/2022 Total lbs lost to date: 0 Total lbs lost since last in-office visit: +1  Interim History: Brittany Morrow is here today for her first follow-up office visit since starting the program with [place].  All blood work/ lab tests that were recently ordered by myself or an outside provider were reviewed with patient today per their request.   Extended time was spent counseling her on all new disease processes that were discovered or preexisting ones that are affected by BMI.  she understands that many of these abnormalities will need to monitored regularly along with the current treatment plan of prudent dietary changes, in which we are making each and every office visit, to improve these health parameters.  She mostly followed meal plan at lunch time.  Subjective:   1. Other specified hypothyroidism Discussed labs with patient today.  Brittany Morrow is taking 88 mcg of Synthroid.  She is asymptomatic.  2. B12 deficiency due to diet Discussed labs with patient today. Brittany Morrow is taking [phone] mcg OTC she thinks but is not sure.  She was on B12 injections for 2 months in remote past.   3. Vitamin D deficiency Worsening. Discussed labs with patient today. Brittany Morrow is taking OTC Vitamin D, with unknown dose. Serum Ca+ low at 8.6.  4. Insulin resistance New diagnosis.  Discussed labs with patient today. Brittany Morrow father passed because of Diabetes Mellitus and her mother has Diabetes Mellitus.   5. At risk of diabetes mellitus Brittany Morrow is at higher than average risk for developing diabetes due to her obesity  and insulin resistance. Brittany Morrow was given diabetes prevention education and counseling today of more than 24 minutes.  - Counseled patient on pathophysiology of disease and meaning/ implication of lab results.  - Reviewed how certain foods can either stimulate or inhibit insulin release, and subsequently affect hunger pathways  - Importance of following a healthy meal plan with limiting amounts of simple carbohydrates discussed with patient - Effects of regular aerobic exercise on blood sugar regulation reviewed and encouraged an eventual goal of 30 min 5d/week or more as a minimum.  - Briefly discussed treatment options, which always include dietary and lifestyle modification as first line.   - Handouts provided at patient's desire and/or told to go online to the American Diabetes Association website for further information.   Assessment/Plan:  No orders of the defined types were placed in this encounter.   There are no discontinued medications.   Meds ordered this encounter  Medications   vitamin B-12 (CYANOCOBALAMIN) 1000 MCG tablet    Sig: 1 po qd   Vitamin D, Ergocalciferol, (DRISDOL) 1.25 MG (50000 UNIT) CAPS capsule    Sig: Take 1 capsule (50,000 Units total) by mouth every 7 (seven) days.    Dispense:  4 capsule    Refill:  0     1. Other specified hypothyroidism Increased TSH slightly but normal free T4.  Brittany Morrow will discuss with her PCP to see if she feels need for medication change.  However due to Free T4 being in normal range, I do not  recommend change in dose of synthroid.    2. B12 deficiency due to diet The diagnosis was reviewed with the patient. Counseling provided today, see below. We will continue to monitor. Orders and follow up as documented in patient record.  Counseling The body needs vitamin B12: to make red blood cells; to make DNA; and to help the nerves work properly so they can carry messages from the brain to the body.  The main causes of vitamin B12  deficiency include dietary deficiency, digestive diseases, pernicious anemia, and having a surgery in which part of the stomach or small intestine is removed.  Certain medicines can make it harder for the body to absorb vitamin B12. These medicines include: heartburn medications; some antibiotics; some medications used to treat diabetes, gout, and high cholesterol.  In some cases, there are no symptoms of this condition. If the condition leads to anemia or nerve damage, various symptoms can occur, such as weakness or fatigue, shortness of breath, and numbness or tingling in your hands and feet.   Treatment:  May include taking vitamin B12 supplements.  Avoid alcohol.  Eat lots of healthy foods that contain vitamin B12: Beef, pork, chicken, [place], and organ meats, such as liver.  Seafood: This includes clams, rainbow trout, salmon, tuna, and haddock. Eggs.  Cereal and dairy products that are fortified: This means that vitamin B12 has been added to the food.   B12 is too low, she will need to double the OTC dose.  Recheck labs in 3 months, if not at goal, consider B12 injections.    Start- vitamin B-12 (CYANOCOBALAMIN) 1000 MCG tablet; 1 po qd  3. Vitamin D deficiency Low Vitamin D level contributes to fatigue and are associated with obesity, breast, and colon cancer. She agrees to continue to take prescription Vitamin D @50 ,000 IU every week and will follow-up for routine testing of Vitamin D, at least 2-3 times per year to avoid over-replacement. Recheck Ca+ and Vitamin D level in 3 months.  Start- Vitamin D, Ergocalciferol, (DRISDOL) 1.25 MG (50000 UNIT) CAPS capsule; Take 1 capsule (50,000 Units total) by mouth every 7 (seven) days.  Dispense: 4 capsule; Refill: 0  4. Insulin resistance Brittany Morrow will continue to work on weight loss, exercise, and decreasing simple carbohydrates to help decrease the risk of diabetes. Brittany Morrow agreed to follow-up with Brittany Morrow as directed to closely monitor her  progress.  Handouts given on insulin resistance and metformin.  Brittany Morrow has agreed to read them and will consider medications in the near future as needed.  5. At risk of diabetes mellitus Brittany Morrow was given approximately 15 minutes of diabetic education and counseling today. We discussed intensive lifestyle modifications today with an emphasis on weight loss as well as increasing exercise and decreasing simple carbohydrates in her diet. We also reviewed medication options with an emphasis on risk versus benefits of those discussed.  Repetitive spaced learning was employed today to elicit superior memory formation and behavioral change.   6. Obesity, current BMI 32.2 Handouts:  Recipe packet 1. Brittany Morrow;s goal is to meal prep.  Brittany Morrow is currently in the action stage of change. As such, her goal is to continue with weight loss efforts. She has agreed to the Category 1 Plan with lunch options.  Exercise goals:  As is.  Behavioral modification strategies: meal planning and cooking strategies and planning for success.  Brittany Morrow has agreed to follow-up with our clinic in 2-3 weeks. She was informed of the importance of frequent follow-up visits to  maximize her success with intensive lifestyle modifications for her multiple health conditions.   Objective:   Blood pressure 118/86, pulse 90, temperature 98 F (36.7 C), height 5\' 3"  (1.6 m), weight 182 lb (82.6 kg), SpO2 97 %. Body mass index is 32.24 kg/m.  General: Cooperative, alert, well developed, in no acute distress. HEENT: Conjunctivae and lids unremarkable. Cardiovascular: Regular rhythm.  Lungs: Normal work of breathing. Neurologic: No focal deficits.   Lab Results  Component Value Date   CREATININE 0.61 06/11/2022   BUN 12 06/11/2022   NA 137 06/11/2022   K 4.3 06/11/2022   CL 101 06/11/2022   CO2 20 06/11/2022   Lab Results  Component Value Date   ALT 17 06/11/2022   AST 20 06/11/2022   ALKPHOS 65 06/11/2022   BILITOT 0.4  06/11/2022   Lab Results  Component Value Date   HGBA1C 5.5 06/11/2022   HGBA1C 5.6 11/20/2021   HGBA1C 5.4 07/06/2019   HGBA1C 5.4 07/23/2016   HGBA1C 5.3 08/06/2013   Lab Results  Component Value Date   INSULIN 13.1 06/11/2022   Lab Results  Component Value Date   TSH 6.480 (H) 06/11/2022   Lab Results  Component Value Date   CHOL 190 06/11/2022   HDL 67 06/11/2022   LDLCALC 98 06/11/2022   TRIG 143 06/11/2022   CHOLHDL 3 11/20/2021   Lab Results  Component Value Date   VD25OH 21.8 (L) 06/11/2022   VD25OH 29.40 (L) 11/20/2021   VD25OH 24.19 (L) 07/06/2019   Lab Results  Component Value Date   WBC 9.3 06/11/2022   HGB 12.8 06/11/2022   HCT 39.7 06/11/2022   MCV 85 06/11/2022   PLT 269 06/11/2022   No results found for: "IRON", "TIBC", "FERRITIN"  Attestation Statements:   Reviewed by clinician on day of visit: allergies, medications, problem list, medical history, surgical history, family history, social history, and previous encounter notes.  I, 06/13/2022, RMA, am acting as [employer] for Energy manager, DO.   I have reviewed the above documentation for accuracy and completeness, and I agree with the above. [employer], D.O.  The 21st Century Cures Act was signed into law in 2016 which includes the topic of electronic health records.  This provides immediate access to information in MyChart.  This includes consultation notes, operative notes, office notes, lab results and pathology reports.  If you have any questions about what you read please let 2017 know at your next visit so we can discuss your concerns and take corrective action if need be.  We are right here with you.

## 2022-07-04 ENCOUNTER — Other Ambulatory Visit (HOSPITAL_COMMUNITY): Payer: Self-pay

## 2022-07-04 ENCOUNTER — Ambulatory Visit: Payer: No Typology Code available for payment source | Admitting: Nurse Practitioner

## 2022-07-22 ENCOUNTER — Ambulatory Visit (INDEPENDENT_AMBULATORY_CARE_PROVIDER_SITE_OTHER): Payer: No Typology Code available for payment source | Admitting: Family Medicine

## 2022-07-29 ENCOUNTER — Other Ambulatory Visit (HOSPITAL_COMMUNITY): Payer: Self-pay

## 2022-07-29 ENCOUNTER — Other Ambulatory Visit: Payer: Self-pay | Admitting: Allergy

## 2022-07-30 ENCOUNTER — Other Ambulatory Visit: Payer: Self-pay | Admitting: Allergy

## 2022-07-30 ENCOUNTER — Other Ambulatory Visit (HOSPITAL_COMMUNITY): Payer: Self-pay

## 2022-07-30 MED ORDER — NORETHIN ACE-ETH ESTRAD-FE 1-20 MG-MCG PO TABS
1.0000 | ORAL_TABLET | Freq: Every day | ORAL | 0 refills | Status: DC
  Filled 2022-07-30: qty 28, 28d supply, fill #0

## 2022-08-02 ENCOUNTER — Other Ambulatory Visit (HOSPITAL_COMMUNITY): Payer: Self-pay

## 2022-08-05 ENCOUNTER — Other Ambulatory Visit (HOSPITAL_COMMUNITY): Payer: Self-pay

## 2022-08-07 ENCOUNTER — Encounter (INDEPENDENT_AMBULATORY_CARE_PROVIDER_SITE_OTHER): Payer: Self-pay

## 2022-08-15 ENCOUNTER — Ambulatory Visit (INDEPENDENT_AMBULATORY_CARE_PROVIDER_SITE_OTHER): Payer: No Typology Code available for payment source | Admitting: Family Medicine

## 2022-09-03 ENCOUNTER — Telehealth: Payer: No Typology Code available for payment source | Admitting: Physician Assistant

## 2022-09-03 DIAGNOSIS — H00012 Hordeolum externum right lower eyelid: Secondary | ICD-10-CM | POA: Diagnosis not present

## 2022-09-04 ENCOUNTER — Other Ambulatory Visit (HOSPITAL_COMMUNITY): Payer: Self-pay

## 2022-09-04 MED ORDER — ERYTHROMYCIN 5 MG/GM OP OINT
1.0000 | TOPICAL_OINTMENT | Freq: Two times a day (BID) | OPHTHALMIC | 0 refills | Status: DC
  Filled 2022-09-04: qty 3.5, 5d supply, fill #0

## 2022-09-04 NOTE — Progress Notes (Signed)
  E-Visit for Stye   We are sorry that you are not feeling well. Here is how we plan to help!  Based on what you have shared with me it looks like you have a stye.  A stye is an inflammation of the eyelid.  It is often a red, painful lump near the edge of the eyelid that may look like a boil or a pimple.  A stye develops when an infection occurs at the base of an eyelash.   We have made appropriate suggestions for you based upon your presentation: Simple styes can be treated without medical intervention.  Most styes either resolve spontaneously or resolve with simple home treatment by applying warm compresses or heated washcloth to the stye for about 10-15 minutes three to four times a day. This causes the stye to drain and resolve.  I will prescribe Erythromycin ointment to apply twice daily for possible infection.   HOME CARE:  Wash your hands often! Let the stye open on its own. Don't squeeze or open it. Don't rub your eyes. This can irritate your eyes and let in bacteria.  If you need to touch your eyes, wash your hands first. Don't wear eye makeup or contact lenses until the area has healed.  GET HELP RIGHT AWAY IF:  Your symptoms do not improve. You develop blurred or loss of vision. Your symptoms worsen (increased discharge, pain or redness).   Thank you for choosing an e-visit.  Your e-visit answers were reviewed by a board certified advanced clinical practitioner to complete your personal care plan. Depending upon the condition, your plan could have included both over the counter or prescription medications.  Please review your pharmacy choice. Make sure the pharmacy is open so you can pick up prescription now. If there is a problem, you may contact your provider through [employer] and have the prescription routed to another pharmacy.  Your safety is important to [place]. If you have drug allergies check your prescription carefully.   For the next 24 hours you can use  MyChart to ask questions about today's visit, request a non-urgent call back, or ask for a work or school excuse. You will get an email in the next two days asking about your experience. I hope that your e-visit has been valuable and will speed your recovery.  I provided 5 minutes of non face-to-face time during this encounter for chart review and documentation.

## 2022-09-23 ENCOUNTER — Telehealth: Payer: No Typology Code available for payment source | Admitting: Physician Assistant

## 2022-09-23 DIAGNOSIS — O2340 Unspecified infection of urinary tract in pregnancy, unspecified trimester: Secondary | ICD-10-CM

## 2022-09-23 NOTE — Progress Notes (Signed)
Because you are pregnant, I feel your condition warrants further evaluation and I recommend that you be seen in a face to face visit. Any UTI or vaginal symptom should be evaluated in person during pregnancy for appropriate testing as some bacteria can be harmful to the baby and certain medications cannot be used while pregnant.    NOTE: There will be NO CHARGE for this eVisit   If you are having a true medical emergency please call 911.      For an urgent face to face visit, [facility] has seven urgent care centers for your convenience:     [facility] [place] Get Driving Directions [phone] [place], [place] [address]    [facility]) Get Driving Directions [phone] [place], [place] [address]  [facility] ([place]) Get Driving Directions [phone] [address] [place],  [place]  [address]  [facility] [place] Get Driving Directions  [phone] [address] [place],  [place] [address]   [facility] Get Driving Directions [phone] [place], [place], [place] [address]   [facility] Get Driving Directions  [phone] [address] [place], [place] [address]   [facility] [place] Get Driving Directions [phone] [address] [place], [place] [address]  Your MyChart E-visit questionnaire answers were reviewed by a board certified advanced clinical practitioner to complete your personal care plan based on your specific symptoms.  Thank you for using e-Visits.   I provided 5 minutes of non face-to-face time during this encounter for chart review and documentation.

## 2022-10-07 ENCOUNTER — Other Ambulatory Visit (HOSPITAL_COMMUNITY): Payer: Self-pay

## 2022-10-07 MED ORDER — METRONIDAZOLE 500 MG PO TABS
500.0000 mg | ORAL_TABLET | Freq: Two times a day (BID) | ORAL | 0 refills | Status: DC
  Filled 2022-10-07: qty 14, 7d supply, fill #0

## 2022-10-14 ENCOUNTER — Other Ambulatory Visit (HOSPITAL_COMMUNITY): Payer: Self-pay

## 2022-10-14 ENCOUNTER — Other Ambulatory Visit (HOSPITAL_BASED_OUTPATIENT_CLINIC_OR_DEPARTMENT_OTHER): Payer: Self-pay

## 2022-10-18 ENCOUNTER — Other Ambulatory Visit (HOSPITAL_COMMUNITY): Payer: Self-pay

## 2022-10-28 LAB — OB RESULTS CONSOLE HEPATITIS B SURFACE ANTIGEN: Hepatitis B Surface Ag: NEGATIVE

## 2022-10-28 LAB — OB RESULTS CONSOLE HIV ANTIBODY (ROUTINE TESTING): HIV: NONREACTIVE

## 2022-10-28 LAB — OB RESULTS CONSOLE RUBELLA ANTIBODY, IGM: Rubella: IMMUNE

## 2022-10-28 LAB — OB RESULTS CONSOLE RPR: RPR: NONREACTIVE

## 2022-10-28 LAB — TSH: TSH: 3.86 (ref 0.41–5.90)

## 2022-11-25 ENCOUNTER — Encounter: Payer: No Typology Code available for payment source | Admitting: Nurse Practitioner

## 2022-11-28 ENCOUNTER — Other Ambulatory Visit (HOSPITAL_COMMUNITY): Payer: Self-pay

## 2022-11-28 ENCOUNTER — Encounter: Payer: Self-pay | Admitting: Nurse Practitioner

## 2022-11-28 ENCOUNTER — Ambulatory Visit (INDEPENDENT_AMBULATORY_CARE_PROVIDER_SITE_OTHER): Payer: No Typology Code available for payment source | Admitting: Nurse Practitioner

## 2022-11-28 VITALS — BP 100/68 | HR 81 | Temp 96.4°F | Ht 63.0 in | Wt 190.6 lb

## 2022-11-28 DIAGNOSIS — E039 Hypothyroidism, unspecified: Secondary | ICD-10-CM

## 2022-11-28 DIAGNOSIS — Z0000 Encounter for general adult medical examination without abnormal findings: Secondary | ICD-10-CM

## 2022-11-28 DIAGNOSIS — Z3490 Encounter for supervision of normal pregnancy, unspecified, unspecified trimester: Secondary | ICD-10-CM

## 2022-11-28 MED ORDER — LEVOTHYROXINE SODIUM 88 MCG PO TABS
88.0000 ug | ORAL_TABLET | Freq: Every day | ORAL | 3 refills | Status: DC
  Filled 2022-11-28 – 2023-01-23 (×2): qty 90, 90d supply, fill #0
  Filled 2023-04-23: qty 90, 90d supply, fill #1
  Filled 2023-08-18: qty 90, 90d supply, fill #2
  Filled 2023-11-14: qty 90, 90d supply, fill #3

## 2022-11-28 NOTE — Progress Notes (Signed)
BP 100/68 (BP Location: Left Arm, Patient Position: Sitting, Cuff Size: Large)   Pulse 81   Temp (!) 96.4 F (35.8 C) (Temporal)   Ht 5\' 3"  (1.6 m)   Wt 190 lb 9.6 oz (86.5 kg)   LMP 01/19/2022   SpO2 97%   BMI 33.76 kg/m    Subjective:    Patient ID: 01/21/2022, female    DOB: [birth year], 36 y.o.   MRN: [id number]  CC: Chief Complaint  Patient presents with   Annual Exam    CPE no othe concerns pt is fasting.   HPI: Brittany Morrow is a 36 y.o. female presenting on 11/28/2022 for comprehensive medical examination. Current medical complaints include: pregnancy   She is currently 5 months pregnant and following with OB/GYN. She had a lot of nausea, heartburn and fatigue in the first trimester, but is doing slightly better now.   She currently lives with: husband, daughter Menopausal Symptoms: no  Depression Screen done today and results listed below:     11/28/2022    8:51 AM 06/11/2022    7:25 AM 11/20/2021   11:07 AM 07/06/2020    9:22 AM 07/06/2019    8:55 AM  Depression screen PHQ 2/9  Decreased Interest 0 0 0 0 0  Down, Depressed, Hopeless 0 1 0 0 0  PHQ - 2 Score 0 1 0 0 0  Altered sleeping  1   2  Tired, decreased energy  3   2  Change in appetite  1   0  Feeling bad or failure about yourself   0   0  Trouble concentrating  0   0  Moving slowly or fidgety/restless  1   0  Suicidal thoughts  0   0  PHQ-9 Score  7   4  Difficult doing work/chores  Not difficult at all   Somewhat difficult    The patient does not have a history of falls. I did not complete a risk assessment for falls. A plan of care for falls was not documented.   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   clobetasol ointment (TEMOVATE) 0.05 % Can apply to eczema on hands twice daily as needed during severe flares.  Do not use for longer than two weeks in a row.   erythromycin ophthalmic ointment Place 1 Application into the right eye in the morning and at bedtime for 5 days   fluticasone (FLONASE) 50 MCG/ACT nasal spray Place 1 spray into both nostrils in the morning and at bedtime.   ibuprofen (ADVIL) 100 MG tablet Take 200 mg by mouth  every 6 (six) hours as needed for fever.   Multiple Vitamin (MULTIVITAMIN) tablet Take 1 tablet by mouth daily.   PREVIDENT 5000 BOOSTER PLUS 1.1 % PSTE Brush a pea sized amount on teeth for 2 to 3 minutes. Spit out excess and leave on overnight. Do not rinse off. Do not swallow. Use 3 to 4 times a week.   azelastine (ASTELIN) 0.1 % nasal spray Place 1 spray into both nostrils 2 (two) times daily. (Patient not taking: Reported on 11/28/2022)   No current facility-administered medications on file prior to visit.    Allergies:  Allergies  Allergen Reactions   Sulfa Antibiotics Palpitations   Tramadol Itching    Social History:  Social History   Socioeconomic History   Marital status: Significant Other    Spouse name: Not on file   Number of children: 1   Years of education: College   Highest education level: Not on file  Occupational History   Occupation: Programmer, multimedia: [employer]    Comment: [employer]  Tobacco Use   Smoking status: Never   Smokeless tobacco: Never  Vaping Use    Vaping Use: Never used  Substance and Sexual Activity   Alcohol use: Yes    Comment: RARE-SOCIALLY ONLY   Drug use: No   Sexual activity: Yes    Birth control/protection: Pill  Other Topics Concern   Not on file  Social History Narrative   Not on file   Social Determinants of Health   Financial Resource Strain: Not on file  Food Insecurity: Not on file  Transportation Needs: Not on file  Physical Activity: Not on file  Stress: Not on file  Social Connections: Not on file  Intimate Partner Violence: Not on file   Social History   Tobacco Use  Smoking Status Never  Smokeless Tobacco Never   Social History   Substance and Sexual Activity  Alcohol Use Yes   Comment: RARE-SOCIALLY ONLY    Family History:  Family History  Problem Relation Age of Onset   Hypertension Mother    Hyperlipidemia Mother    Diabetes Father    Alcohol abuse Father    Diabetes Paternal Grandmother    Healthy Daughter     Past medical history, surgical history, medications, allergies, family history and social history reviewed with patient today and changes made to appropriate areas of the chart.   Review of Systems  Constitutional:  Positive for malaise/fatigue. Negative for fever.  HENT: Negative.    Respiratory: Negative.    Cardiovascular: Negative.   Gastrointestinal:  Positive for constipation. Negative for abdominal pain, nausea and vomiting.  Genitourinary:  Positive for frequency. Negative for dysuria.  Musculoskeletal: Negative.   Skin: Negative.   Neurological: Negative.   Psychiatric/Behavioral: Negative.     All other ROS negative except what is listed above and in the HPI.      Objective:    BP 100/68 (BP Location: Left Arm, Patient Position: Sitting, Cuff Size: Large)   Pulse 81   Temp (!) 96.4 F (35.8 C) (Temporal)   Ht 5\' 3"  (1.6 m)   Wt 190 lb 9.6 oz (86.5 kg)   LMP 01/19/2022   SpO2 97%   BMI 33.76 kg/m   Wt Readings from Last 3 Encounters:  11/28/22 190  lb 9.6 oz (86.5 kg)  06/25/22 182 lb (82.6 kg)  06/11/22 181 lb (82.1 kg)    Physical Exam Vitals and nursing note reviewed.  Constitutional:  General: She is not in acute distress.    Appearance: Normal appearance.  HENT:     Head: Normocephalic and atraumatic.     Right Ear: Tympanic membrane, ear canal and external ear normal.     Left Ear: Tympanic membrane, ear canal and external ear normal.  Eyes:     Conjunctiva/sclera: Conjunctivae normal.  Cardiovascular:     Rate and Rhythm: Normal rate and regular rhythm.     Pulses: Normal pulses.     Heart sounds: Normal heart sounds.  Pulmonary:     Effort: Pulmonary effort is normal.     Breath sounds: Normal breath sounds.  Abdominal:     Palpations: Abdomen is soft.     Tenderness: There is no abdominal tenderness.  Musculoskeletal:        General: Normal range of motion.     Cervical back: Normal range of motion and neck supple. No tenderness.     Right lower leg: No edema.     Left lower leg: No edema.  Lymphadenopathy:     Cervical: No cervical adenopathy.  Skin:    General: Skin is warm and dry.  Neurological:     General: No focal deficit present.     Mental Status: She is alert and oriented to person, place, and time.     Cranial Nerves: No cranial nerve deficit.     Coordination: Coordination normal.     Gait: Gait normal.  Psychiatric:        Mood and Affect: Mood normal.        Behavior: Behavior normal.        Thought Content: Thought content normal.        Judgment: Judgment normal.     Results for orders placed or performed in visit on 11/28/22  TSH  Result Value Ref Range   TSH 3.86 0.41 - 5.90      Assessment & Plan:   Problem List Items Addressed This Visit       Endocrine   Acquired hypothyroidism    Chronic, stable. Most recent TSH was 3.86. Will continue levothyroxine 7mcg daily, refill sent to the pharmacy.       Relevant Medications   levothyroxine (SYNTHROID) 88 MCG tablet    Other Visit Diagnoses     Routine general medical examination at a health care facility    -  Primary   Health maintenance reviewed and updated. Requesting records for last pap. Recent labs reviewed. Follow-up in 1 year   Pregnancy, unspecified gestational age       She is currently following with OBGYN. Medication list reviewed.        Follow up plan: Return in about 1 year (around 11/29/2023) for CPE.   LABORATORY TESTING:  - Pap smear: done elsewhere - will request records  IMMUNIZATIONS:   - Tdap: Tetanus vaccination status reviewed: last tetanus booster within 10 years. - Influenza: Up to date - Pneumovax: Not applicable - Prevnar: Not applicable - HPV: Not applicable - Zostavax vaccine: Not applicable  SCREENING: -Mammogram: Not applicable  - Colonoscopy: Not applicable  - Bone Density: Not applicable  -Hearing Test: Not applicable  -Spirometry: Not applicable   PATIENT COUNSELING:   Advised to take 1 mg of folate supplement per day if capable of pregnancy.   Sexuality: Discussed sexually transmitted diseases, partner selection, use of condoms, avoidance of unintended pregnancy  and contraceptive alternatives.   Advised to avoid cigarette smoking.  I discussed with the patient that  most people either abstain from alcohol or drink within safe limits (<=14/week and <=4 drinks/occasion for males, <=7/weeks and <= 3 drinks/occasion for females) and that the risk for alcohol disorders and other health effects rises proportionally with the number of drinks per week and how often a drinker exceeds daily limits.  Discussed cessation/primary prevention of drug use and availability of treatment for abuse.   Diet: Encouraged to adjust caloric intake to maintain  or achieve ideal body weight, to reduce intake of dietary saturated fat and total fat, to limit sodium intake by avoiding high sodium foods and not adding table salt, and to maintain adequate dietary potassium and  calcium preferably from fresh fruits, vegetables, and low-fat dairy products.    stressed the importance of regular exercise  Injury prevention: Discussed safety belts, safety helmets, smoke detector, smoking near bedding or upholstery.   Dental health: Discussed importance of regular tooth brushing, flossing, and dental visits.    NEXT PREVENTATIVE PHYSICAL DUE IN 1 YEAR. Return in about 1 year (around 11/29/2023) for CPE.

## 2022-11-28 NOTE — Assessment & Plan Note (Signed)
Chronic, stable. Most recent TSH was 3.86. Will continue levothyroxine daily, refill sent to the pharmacy.

## 2022-11-28 NOTE — Patient Instructions (Signed)
It was great to see you!  I have sent your levothyroxine into the pharmacy.   Let's follow-up in 1 year, sooner if you have concerns.  If a referral was placed today, you will be contacted for an appointment. Please note that routine referrals can sometimes take up to 3-4 weeks to process. Please call our office if you haven't heard anything after this time frame.  Take care,  Rodman Pickle, NP

## 2022-12-28 ENCOUNTER — Other Ambulatory Visit (HOSPITAL_COMMUNITY): Payer: Self-pay

## 2022-12-28 ENCOUNTER — Ambulatory Visit (HOSPITAL_COMMUNITY)
Admission: EM | Admit: 2022-12-28 | Discharge: 2022-12-28 | Disposition: A | Discharge status: Home / Self Care | Payer: No Typology Code available for payment source | Attending: Urgent Care | Admitting: Urgent Care

## 2022-12-28 ENCOUNTER — Encounter (HOSPITAL_COMMUNITY): Payer: Self-pay | Admitting: Urgent Care

## 2022-12-28 ENCOUNTER — Other Ambulatory Visit: Payer: Self-pay

## 2022-12-28 DIAGNOSIS — U071 COVID-19: Secondary | ICD-10-CM | POA: Diagnosis not present

## 2022-12-28 DIAGNOSIS — Z79899 Other long term (current) drug therapy: Secondary | ICD-10-CM | POA: Diagnosis not present

## 2022-12-28 DIAGNOSIS — R059 Cough, unspecified: Secondary | ICD-10-CM | POA: Insufficient documentation

## 2022-12-28 DIAGNOSIS — J069 Acute upper respiratory infection, unspecified: Secondary | ICD-10-CM | POA: Insufficient documentation

## 2022-12-28 LAB — SARS CORONAVIRUS 2 (TAT 6-24 HRS): SARS Coronavirus 2: POSITIVE — AB

## 2022-12-28 MED ORDER — BUDESONIDE 32 MCG/ACT NA SUSP
1.0000 | Freq: Two times a day (BID) | NASAL | 0 refills | Status: AC
Start: 2022-12-28 — End: ?
  Filled 2022-12-28: qty 8.43, 30d supply, fill #0

## 2022-12-28 NOTE — Discharge Instructions (Signed)
Your symptoms sound most consistent with a viral illness. We will call with results of your covid test once obtained. Supportive care is the mainstay of treatment - rest, hydration, frequent hand washing. Use tylenol as needed for aches, pains or fever. Do not take ibuprofen or NSAIDs while pregnant. You may use robitussin (guaifenesin / dextromethorphan) while pregnant. Please avoid sudafed.  Stop your flonase, I have changed this to Rhinocort. You may also use saline to flush the sinus passages. Humidification/ vaporizer may be beneficial. Eucalyptus can be helpful with congestion.

## 2022-12-28 NOTE — ED Provider Notes (Signed)
[facility]    CSN: [id number] Arrival date & time: 12/28/22  1012      History   Chief Complaint Chief Complaint  Patient presents with   URI    HPI Brittany Morrow is a 36 y.o. female.   Pleasant 36 year old female who is 5 months pregnant presents today due to concerns of nasal congestion, postnasal drainage, generalized achiness, dry cough, and mild sore throat.  Feels like her lymph nodes are swollen.  Feels rundown.  Has been taking over-the-counter Robitussin.  Denies any fever.  No shortness of breath or chest pain.   URI   Past Medical History:  Diagnosis Date   B12 deficiency    Chlamydia 2002, 2005   Dyshidrotic eczema    Gonorrhea 2014   History of cervical dysplasia 07/31/2012   VAIN I and CIN 1 cured with Laser vaporization of cervix and vagina 2006   Hypothyroidism    Irritable bowel syndrome with both constipation and diarrhea 07/06/2020   Knee pain    Vitamin D deficiency    Vitamin D deficiency     Patient Active Problem List   Diagnosis Date Noted   B12 deficiency due to diet 07/06/2020   Irritable bowel syndrome with both constipation and diarrhea 07/06/2020   Oral contraceptive use 07/06/2020   Obesity (BMI 30.0-34.9) 03/11/2017   Acquired hypothyroidism 12/27/2015   History of cervical dysplasia 07/31/2012    Past Surgical History:  Procedure Laterality Date   ABDOMINOPLASTY     2018   CHOLECYSTECTOMY     LASER VAPORIZATION OF CERVIX AND VAGINA  2008   [facility]--DR FERNANDEZ   REDUCTION MAMMAPLASTY      OB History     Gravida  2   Para  1   Term      Preterm      AB      Living  1      SAB      IAB      Ectopic      Multiple      Live Births               Home Medications    Prior to Admission medications   Medication Sig Start Date End Date Taking? Authorizing Provider  budesonide (RHINOCORT AQUA) 32 MCG/ACT nasal spray Place 1 spray into both nostrils in the morning and at bedtime. 12/28/22   Yes Mahaila Tischer L, PA  Cholecalciferol (VITAMIN D3 PO) Take by mouth daily.    [provider]  clobetasol ointment (TEMOVATE) 0.05 % Can apply to eczema on hands twice daily as needed during severe flares.  Do not use for longer than two weeks in a row. 04/27/21   Padgett, Pilar Grammes, MD  ibuprofen (ADVIL) 100 MG tablet Take 200 mg by mouth every 6 (six) hours as needed for fever.    [provider]  levothyroxine (SYNTHROID) 88 MCG tablet Take 1 tablet (88 mcg total) by mouth daily. 11/28/22 11/28/23  McElwee, Jake Church, NP  Multiple Vitamin (MULTIVITAMIN) tablet Take 1 tablet by mouth daily.    [provider]  PREVIDENT 5000 BOOSTER PLUS 1.1 % PSTE Brush a pea sized amount on teeth for 2 to 3 minutes. Spit out excess and leave on overnight. Do not rinse off. Do not swallow. Use 3 to 4 times a week. 01/29/22       Family History Family History  Problem Relation Age of Onset   Hypertension Mother  Hyperlipidemia Mother    Diabetes Father    Alcohol abuse Father    Diabetes Paternal Grandmother    Healthy Daughter     Social History Social History   Tobacco Use   Smoking status: Never   Smokeless tobacco: Never  Vaping Use   Vaping Use: Never used  Substance Use Topics   Alcohol use: Yes    Comment: RARE-SOCIALLY ONLY   Drug use: No     Allergies   Sulfa antibiotics and Tramadol   Review of Systems Review of Systems As per HPI  Physical Exam Triage Vital Signs ED Triage Vitals  Enc Vitals Group     BP 12/28/22 1140 114/78     Pulse Rate 12/28/22 1140 99     Resp 12/28/22 1140 (!) 22     Temp 12/28/22 1140 98.3 F (36.8 C)     Temp Source 12/28/22 1140 Oral     SpO2 12/28/22 1140 98 %     Weight --      Height --      Head Circumference --      Peak Flow --      Pain Score 12/28/22 1137 8     Pain Loc --      Pain Edu? --      Excl. in GC? --    No data found.  Updated Vital Signs BP 114/78 (BP Location: Right Arm)    Pulse 99   Temp 98.3 F (36.8 C) (Oral)   Resp (!) 22   LMP 01/19/2022   SpO2 98%   Visual Acuity Right Eye Distance:   Left Eye Distance:   Bilateral Distance:    Right Eye Near:   Left Eye Near:    Bilateral Near:     Physical Exam Vitals and nursing note reviewed.  Constitutional:      General: She is not in acute distress.    Appearance: Normal appearance. She is well-developed. She is not ill-appearing, toxic-appearing or diaphoretic.  HENT:     Head: Normocephalic and atraumatic.     Right Ear: Tympanic membrane, ear canal and external ear normal. There is no impacted cerumen.     Left Ear: Tympanic membrane, ear canal and external ear normal. There is no impacted cerumen.     Nose: Congestion and rhinorrhea (clear) present.     Comments: Turbinate hypertrophy bilaterally    Mouth/Throat:     Mouth: Mucous membranes are moist.     Pharynx: Oropharynx is clear. No oropharyngeal exudate or posterior oropharyngeal erythema.  Eyes:     General: No scleral icterus.       Right eye: No discharge.        Left eye: No discharge.     Extraocular Movements: Extraocular movements intact.     Conjunctiva/sclera: Conjunctivae normal.     Pupils: Pupils are equal, round, and reactive to light.  Cardiovascular:     Rate and Rhythm: Normal rate and regular rhythm.     Heart sounds: No murmur heard. Pulmonary:     Effort: Pulmonary effort is normal. No accessory muscle usage, respiratory distress or retractions.     Breath sounds: Normal breath sounds and air entry. No stridor, decreased air movement or transmitted upper airway sounds. No decreased breath sounds, wheezing, rhonchi or rales.  Musculoskeletal:        General: No swelling.     Cervical back: Normal range of motion and neck supple. No rigidity or tenderness.  Lymphadenopathy:  Cervical: Cervical adenopathy (B anterior cervical chain) present.  Skin:    General: Skin is warm and dry.     Capillary Refill:  Capillary refill takes less than 2 seconds.     Coloration: Skin is not jaundiced.     Findings: No bruising, erythema or rash.  Neurological:     General: No focal deficit present.     Mental Status: She is alert and oriented to person, place, and time.  Psychiatric:        Mood and Affect: Mood normal.      UC Treatments / Results  Labs (all labs ordered are listed, but only abnormal results are displayed) Labs Reviewed  SARS CORONAVIRUS 2 (TAT 6-24 HRS)    EKG   Radiology No results found.  Procedures Procedures (including critical care time)  Medications Ordered in UC Medications - No data to display  Initial Impression / Assessment and Plan / UC Course  I have reviewed the triage vital signs and the nursing notes.  Pertinent labs & imaging results that were available during my care of the patient were reviewed by me and considered in my medical decision making (see chart for details).     Viral URI with cough -COVID testing performed, however patient would not be candidate for antiviral treatment as she is currently pregnant.  Patient is past the treatment window for influenza therefore will defer testing at this time.  She is afebrile in clinic and overall looks well.  Will have her switch from Flonase to Rhinocort nasal spray.  Saline flushes also recommended.  May continue Robitussin as long as it does not contain Sudafed.  Recommended follow-up with OB for any further symptoms.   Final Clinical Impressions(s) / UC Diagnoses   Final diagnoses:  Viral URI with cough     Discharge Instructions      Your symptoms sound most consistent with a viral illness. We will call with results of your covid test once obtained. Supportive care is the mainstay of treatment - rest, hydration, frequent hand washing. Use tylenol as needed for aches, pains or fever. Do not take ibuprofen or NSAIDs while pregnant. You may use robitussin (guaifenesin / dextromethorphan) while  pregnant. Please avoid sudafed.  Stop your flonase, I have changed this to Rhinocort. You may also use saline to flush the sinus passages. Humidification/ vaporizer may be beneficial. Eucalyptus can be helpful with congestion.     ED Prescriptions     Medication Sig Dispense Auth. Provider   budesonide (RHINOCORT AQUA) 32 MCG/ACT nasal spray Place 1 spray into both nostrils in the morning and at bedtime. 8.43 mL Terre Zabriskie L, PA      PDMP not reviewed this encounter.   Maretta Bees, [place] 12/28/22 1202

## 2022-12-28 NOTE — ED Triage Notes (Signed)
Symptoms started Wednesday, 12/25/2022.  Symptoms include sore throat, ear pain, productive cough, green phlegm and a runny nose.    Patient has taken robitussin.    Patient is pregnant, EDD 05/11/2023

## 2022-12-30 NOTE — L&D Delivery Note (Signed)
Delivery Note At 11:43 PM a viable and healthy female was delivered via Vaginal, Spontaneous (Presentation: Left Occiput Anterior).  APGAR: 8, 9; weight  pending.   Placenta status: Spontaneous, Intact.  Cord: 3 vessels with the following complications: None.  Cord pH: na.  x one reduced on perineum  Anesthesia: Epidural Episiotomy: None Lacerations:  na Suture Repair:  na Est. Blood Loss (mL):  198  Mom to postpartum.  Baby to Couplet care / Skin to Skin.  Kaynen Minner J 05/13/2023, 11:51 PM

## 2023-01-01 ENCOUNTER — Telehealth (INDEPENDENT_AMBULATORY_CARE_PROVIDER_SITE_OTHER): Payer: 59 | Admitting: Nurse Practitioner

## 2023-01-01 ENCOUNTER — Other Ambulatory Visit (HOSPITAL_COMMUNITY): Payer: Self-pay

## 2023-01-01 ENCOUNTER — Other Ambulatory Visit (INDEPENDENT_AMBULATORY_CARE_PROVIDER_SITE_OTHER): Payer: 59

## 2023-01-01 ENCOUNTER — Encounter: Payer: Self-pay | Admitting: Nurse Practitioner

## 2023-01-01 DIAGNOSIS — U071 COVID-19: Secondary | ICD-10-CM

## 2023-01-01 DIAGNOSIS — R0602 Shortness of breath: Secondary | ICD-10-CM | POA: Diagnosis not present

## 2023-01-01 DIAGNOSIS — Z3490 Encounter for supervision of normal pregnancy, unspecified, unspecified trimester: Secondary | ICD-10-CM | POA: Insufficient documentation

## 2023-01-01 LAB — D-DIMER, QUANTITATIVE: D-Dimer, Quant: 0.43 mcg/mL FEU (ref <0.50)

## 2023-01-01 MED ORDER — ALBUTEROL SULFATE HFA 108 (90 BASE) MCG/ACT IN AERS
1.0000 | INHALATION_SPRAY | Freq: Four times a day (QID) | RESPIRATORY_TRACT | 0 refills | Status: DC | PRN
  Filled 2023-01-01: qty 13.4, 90d supply, fill #0

## 2023-01-01 MED ORDER — AMOXICILLIN 500 MG PO CAPS
1000.0000 mg | ORAL_CAPSULE | Freq: Three times a day (TID) | ORAL | 0 refills | Status: AC
  Filled 2023-01-01: qty 30, 5d supply, fill #0

## 2023-01-01 NOTE — Progress Notes (Signed)
Virtual Visit via Video Note  I connected withNAME@ on 01/01/23 at 11:00 AM EST by a video enabled telemedicine application and verified that I am speaking with the correct person using two identifiers.  Location: Patient:Home Provider: Office Participants: patient and provider  I discussed the limitations of evaluation and management by telemedicine and the availability of in person appointments. I also discussed with the patient that there may be a patient responsible charge related to this service. The patient expressed understanding and agreed to proceed.  DV:VOHYW x 1.5wks, positive COVID on 12/28/2022, 20months pregnant  History of Present Illness:  Cough This is a new problem. The current episode started 1 to 4 weeks ago. The problem has been unchanged. The problem occurs constantly. The cough is Productive of blood-tinged sputum. Associated symptoms include nasal congestion and shortness of breath. Pertinent negatives include no chest pain, chills, ear congestion, ear pain, fever, heartburn, hemoptysis, myalgias, rhinorrhea, sore throat, sweats, weight loss or wheezing. Associated symptoms comments: Upper back pain with cough and deep breathes. Nothing aggravates the symptoms. She has tried OTC cough suppressant for the symptoms. The treatment provided mild relief.    Observations/Objective: Physical Exam Vitals reviewed.  Constitutional:      General: She is not in acute distress. Pulmonary:     Effort: Pulmonary effort is normal. No respiratory distress.  Neurological:     Mental Status: She is alert and oriented to person, place, and time.     Assessment and Plan: Brittany Morrow was seen today for acute visit.  Diagnoses and all orders for this visit:  COVID-19 -     amoxicillin (AMOXIL) 500 MG capsule; Take 2 capsules (1,000 mg total) by mouth 3 (three) times daily after meals for 5 days.  SOB (shortness of breath) on exertion -     albuterol (VENTOLIN HFA) 108 (90 Base)  MCG/ACT inhaler; Inhale 1 puff into the lungs every 6 (six) hours as needed for wheezing or shortness of breath. -     amoxicillin (AMOXIL) 500 MG capsule; Take 2 capsules (1,000 mg total) by mouth 3 (three) times daily after meals for 5 days. -     D-Dimer, Quantitative -     CBC with Differential/Platelet -     Basic metabolic panel   Follow Up Instructions: See instructions above   I discussed the assessment and treatment plan with the patient. The patient was provided an opportunity to ask questions and all were answered. The patient agreed with the plan and demonstrated an understanding of the instructions.   The patient was advised to call back or seek an in-person evaluation if the symptoms worsen or if the condition fails to improve as anticipated.  Wilfred Lacy, NP

## 2023-01-02 LAB — CBC WITH DIFFERENTIAL/PLATELET
Basophils Absolute: 0.1 10*3/uL (ref 0.0–0.1)
Basophils Relative: 1.3 % (ref 0.0–3.0)
Eosinophils Absolute: 0.1 10*3/uL (ref 0.0–0.7)
Eosinophils Relative: 1 % (ref 0.0–5.0)
HCT: 32.5 % — ABNORMAL LOW (ref 36.0–46.0)
Hemoglobin: 10.9 g/dL — ABNORMAL LOW (ref 12.0–15.0)
Lymphocytes Relative: 21 % (ref 12.0–46.0)
Lymphs Abs: 1.8 10*3/uL (ref 0.7–4.0)
MCHC: 33.5 g/dL (ref 30.0–36.0)
MCV: 84.1 fl (ref 78.0–100.0)
Monocytes Absolute: 0.5 10*3/uL (ref 0.1–1.0)
Monocytes Relative: 5.9 % (ref 3.0–12.0)
Neutro Abs: 6 10*3/uL (ref 1.4–7.7)
Neutrophils Relative %: 70.8 % (ref 43.0–77.0)
Platelets: 249 10*3/uL (ref 150.0–400.0)
RBC: 3.86 Mil/uL — ABNORMAL LOW (ref 3.87–5.11)
RDW: 13.2 % (ref 11.5–15.5)
WBC: 8.5 10*3/uL (ref 4.0–10.5)

## 2023-01-02 LAB — BASIC METABOLIC PANEL
BUN: 6 mg/dL (ref 6–23)
CO2: 25 mEq/L (ref 19–32)
Calcium: 8.9 mg/dL (ref 8.4–10.5)
Chloride: 105 mEq/L (ref 96–112)
Creatinine, Ser: 0.52 mg/dL (ref 0.40–1.20)
GFR: 119.7 mL/min (ref >60.00)
Glucose, Bld: 133 mg/dL — ABNORMAL HIGH (ref 70–99)
Potassium: 3.7 mEq/L (ref 3.5–5.1)
Sodium: 138 mEq/L (ref 135–145)

## 2023-01-09 DIAGNOSIS — O09522 Supervision of elderly multigravida, second trimester: Secondary | ICD-10-CM | POA: Diagnosis not present

## 2023-01-09 DIAGNOSIS — Z362 Encounter for other antenatal screening follow-up: Secondary | ICD-10-CM | POA: Diagnosis not present

## 2023-01-09 DIAGNOSIS — Z3A22 22 weeks gestation of pregnancy: Secondary | ICD-10-CM | POA: Diagnosis not present

## 2023-01-23 ENCOUNTER — Other Ambulatory Visit (HOSPITAL_COMMUNITY): Payer: Self-pay

## 2023-01-23 ENCOUNTER — Other Ambulatory Visit: Payer: Self-pay

## 2023-02-06 ENCOUNTER — Other Ambulatory Visit (HOSPITAL_COMMUNITY): Payer: Self-pay

## 2023-02-06 DIAGNOSIS — Z3A26 26 weeks gestation of pregnancy: Secondary | ICD-10-CM | POA: Diagnosis not present

## 2023-02-06 DIAGNOSIS — O26892 Other specified pregnancy related conditions, second trimester: Secondary | ICD-10-CM | POA: Diagnosis not present

## 2023-02-06 DIAGNOSIS — Z3689 Encounter for other specified antenatal screening: Secondary | ICD-10-CM | POA: Diagnosis not present

## 2023-02-06 DIAGNOSIS — O09522 Supervision of elderly multigravida, second trimester: Secondary | ICD-10-CM | POA: Diagnosis not present

## 2023-02-06 DIAGNOSIS — N898 Other specified noninflammatory disorders of vagina: Secondary | ICD-10-CM | POA: Diagnosis not present

## 2023-02-06 MED ORDER — METRONIDAZOLE 0.75 % VA GEL
VAGINAL | 0 refills | Status: DC
  Filled 2023-02-06: qty 70, 5d supply, fill #0

## 2023-02-06 MED ORDER — METRONIDAZOLE 500 MG PO TABS
ORAL_TABLET | ORAL | 0 refills | Status: DC
  Filled 2023-02-06: qty 14, 7d supply, fill #0

## 2023-02-21 DIAGNOSIS — Z23 Encounter for immunization: Secondary | ICD-10-CM | POA: Diagnosis not present

## 2023-02-21 DIAGNOSIS — O09523 Supervision of elderly multigravida, third trimester: Secondary | ICD-10-CM | POA: Diagnosis not present

## 2023-02-21 DIAGNOSIS — Z8639 Personal history of other endocrine, nutritional and metabolic disease: Secondary | ICD-10-CM | POA: Diagnosis not present

## 2023-02-21 DIAGNOSIS — Z3689 Encounter for other specified antenatal screening: Secondary | ICD-10-CM | POA: Diagnosis not present

## 2023-02-21 DIAGNOSIS — Z3A28 28 weeks gestation of pregnancy: Secondary | ICD-10-CM | POA: Diagnosis not present

## 2023-03-18 DIAGNOSIS — O26899 Other specified pregnancy related conditions, unspecified trimester: Secondary | ICD-10-CM | POA: Diagnosis not present

## 2023-03-18 DIAGNOSIS — Z3483 Encounter for supervision of other normal pregnancy, third trimester: Secondary | ICD-10-CM | POA: Diagnosis not present

## 2023-03-18 DIAGNOSIS — Z3A31 31 weeks gestation of pregnancy: Secondary | ICD-10-CM | POA: Diagnosis not present

## 2023-03-18 DIAGNOSIS — O09523 Supervision of elderly multigravida, third trimester: Secondary | ICD-10-CM | POA: Diagnosis not present

## 2023-03-18 DIAGNOSIS — O99019 Anemia complicating pregnancy, unspecified trimester: Secondary | ICD-10-CM | POA: Diagnosis not present

## 2023-03-18 DIAGNOSIS — Z3482 Encounter for supervision of other normal pregnancy, second trimester: Secondary | ICD-10-CM | POA: Diagnosis not present

## 2023-03-26 ENCOUNTER — Encounter: Payer: Self-pay | Admitting: Nurse Practitioner

## 2023-03-26 ENCOUNTER — Other Ambulatory Visit (HOSPITAL_COMMUNITY): Payer: Self-pay

## 2023-03-26 ENCOUNTER — Ambulatory Visit (INDEPENDENT_AMBULATORY_CARE_PROVIDER_SITE_OTHER): Payer: 59 | Admitting: Nurse Practitioner

## 2023-03-26 VITALS — BP 122/70 | HR 101 | Temp 98.0°F | Ht 60.0 in | Wt 205.8 lb

## 2023-03-26 DIAGNOSIS — R002 Palpitations: Secondary | ICD-10-CM

## 2023-03-26 DIAGNOSIS — R051 Acute cough: Secondary | ICD-10-CM

## 2023-03-26 LAB — POC COVID19 BINAXNOW: SARS Coronavirus 2 Ag: NEGATIVE

## 2023-03-26 MED ORDER — FLUTICASONE PROPIONATE 50 MCG/ACT NA SUSP
2.0000 | Freq: Every day | NASAL | 6 refills | Status: DC
  Filled 2023-03-26: qty 16, 30d supply, fill #0
  Filled 2023-08-18: qty 16, 30d supply, fill #1
  Filled 2023-11-14: qty 16, 30d supply, fill #2

## 2023-03-26 NOTE — Progress Notes (Signed)
EKG interpreted by me on 03/26/23 showed normal sinus rhythm with a heart rate of 99. No ST or T wave changes

## 2023-03-26 NOTE — Patient Instructions (Signed)
It was great to see you!  Start flonase nasal spray daily and zyrtec or claritin  Use your albuterol inhaler as needed for shortness of breath.   Drink plenty of fluids.   Let's follow-up if your symptoms worsen or don't improve.   Take care,  Vance Peper, NP   Safe Medications in Pregnancy   Colds/Coughs/Allergies: Benadryl (alcohol free) 25 mg every 6 hours as needed Breath right strips Claritin Cepacol throat lozenges Chloraseptic throat spray Cold-Eeze- up to three times per day Cough drops, alcohol free Flonase (by prescription only) Guaifenesin Mucinex Robitussin DM (plain only, alcohol free) Saline nasal spray/drops Sudafed (pseudoephedrine) & Actifed ** use only after [redacted] weeks gestation and if you do not have high blood pressure Tylenol Vicks Vaporub Zinc lozenges Zyrtec   **If taking multiple medications, please check labels to avoid duplicating the same active ingredients **take medication as directed on the label ** Do not exceed 4000 mg of tylenol in 24 hours **Do not take medications that contain aspirin or ibuprofen

## 2023-03-26 NOTE — Progress Notes (Unsigned)
   Acute Office Visit  Subjective:     Patient ID: Brittany Morrow, female    DOB: 01-Feb-1986, 37 y.o.   MRN: [id number]  Chief Complaint  Patient presents with   Cough    With congestion, SOB, Heart Palpitations, since Friday    HPI Patient is in today for cough, congestion, and heart palpitations since Friday. Covid test at home was negative.   UPPER RESPIRATORY TRACT INFECTION  Fever: no Cough: yes Shortness of breath: yes Wheezing: yes Chest pain: no Chest tightness: yes Chest congestion: no Nasal congestion: yes Runny nose: yes Post nasal drip: no Sneezing: yes Sore throat: yes Swollen glands: no Sinus pressure: no Headache: yes Face pain: no Toothache: no Ear pain: yes bilateral Ear pressure: no bilateral Eyes red/itching: burning Eye drainage/crusting: no  Vomiting: no Rash: no Fatigue: yes Sick contacts: no Strep contacts: no  Context: worse Recurrent sinusitis: no Relief with OTC cold/cough medications: no  Treatments attempted: vicks vapo rub, mucinex dm, pulmicort   ROS See pertinent positives and negatives per HPI.     Objective:    BP 122/70 (BP Location: Left Arm)   Pulse (!) 101   Temp 98 F (36.7 C)   Ht 5' (1.524 m)   Wt 205 lb 12.8 oz (93.4 kg)   SpO2 98%   BMI 40.19 kg/m  {Vitals History (Optional):23777}  Physical Exam  No results found for any visits on 03/26/23.      Assessment & Plan:   Problem List Items Addressed This Visit   None   No orders of the defined types were placed in this encounter.   No follow-ups on file.  Charyl Dancer, NP

## 2023-03-27 ENCOUNTER — Other Ambulatory Visit (HOSPITAL_COMMUNITY): Payer: Self-pay

## 2023-03-27 ENCOUNTER — Encounter: Payer: Self-pay | Admitting: Nurse Practitioner

## 2023-04-03 DIAGNOSIS — Z3A34 34 weeks gestation of pregnancy: Secondary | ICD-10-CM | POA: Diagnosis not present

## 2023-04-03 DIAGNOSIS — O99013 Anemia complicating pregnancy, third trimester: Secondary | ICD-10-CM | POA: Diagnosis not present

## 2023-04-03 DIAGNOSIS — O09523 Supervision of elderly multigravida, third trimester: Secondary | ICD-10-CM | POA: Diagnosis not present

## 2023-04-09 DIAGNOSIS — O09523 Supervision of elderly multigravida, third trimester: Secondary | ICD-10-CM | POA: Diagnosis not present

## 2023-04-09 DIAGNOSIS — Z3685 Encounter for antenatal screening for Streptococcus B: Secondary | ICD-10-CM | POA: Diagnosis not present

## 2023-04-09 DIAGNOSIS — O99013 Anemia complicating pregnancy, third trimester: Secondary | ICD-10-CM | POA: Diagnosis not present

## 2023-04-09 DIAGNOSIS — Z3A35 35 weeks gestation of pregnancy: Secondary | ICD-10-CM | POA: Diagnosis not present

## 2023-04-09 LAB — OB RESULTS CONSOLE GBS: GBS: NEGATIVE

## 2023-04-16 DIAGNOSIS — Z3A36 36 weeks gestation of pregnancy: Secondary | ICD-10-CM | POA: Diagnosis not present

## 2023-04-16 DIAGNOSIS — O09523 Supervision of elderly multigravida, third trimester: Secondary | ICD-10-CM | POA: Diagnosis not present

## 2023-04-16 DIAGNOSIS — O3663X0 Maternal care for excessive fetal growth, third trimester, not applicable or unspecified: Secondary | ICD-10-CM | POA: Diagnosis not present

## 2023-04-24 ENCOUNTER — Other Ambulatory Visit: Payer: Self-pay

## 2023-05-06 ENCOUNTER — Other Ambulatory Visit: Payer: Self-pay | Admitting: Obstetrics and Gynecology

## 2023-05-12 ENCOUNTER — Encounter (HOSPITAL_COMMUNITY): Payer: Self-pay | Admitting: *Deleted

## 2023-05-12 ENCOUNTER — Telehealth (HOSPITAL_COMMUNITY): Payer: Self-pay | Admitting: *Deleted

## 2023-05-12 NOTE — Telephone Encounter (Signed)
Preadmission screen  

## 2023-05-13 ENCOUNTER — Other Ambulatory Visit: Payer: Self-pay

## 2023-05-13 ENCOUNTER — Encounter (HOSPITAL_COMMUNITY): Payer: Self-pay | Admitting: Obstetrics and Gynecology

## 2023-05-13 ENCOUNTER — Inpatient Hospital Stay (HOSPITAL_COMMUNITY): Payer: 59 | Admitting: Anesthesiology

## 2023-05-13 ENCOUNTER — Inpatient Hospital Stay (HOSPITAL_COMMUNITY)
Admission: AD | Admit: 2023-05-13 | Discharge: 2023-05-15 | DRG: 807 | Disposition: A | Discharge status: Home / Self Care | Payer: 59 | Attending: Obstetrics and Gynecology | Admitting: Obstetrics and Gynecology

## 2023-05-13 ENCOUNTER — Inpatient Hospital Stay (HOSPITAL_COMMUNITY): Payer: 59

## 2023-05-13 DIAGNOSIS — E039 Hypothyroidism, unspecified: Secondary | ICD-10-CM | POA: Diagnosis not present

## 2023-05-13 DIAGNOSIS — Z3490 Encounter for supervision of normal pregnancy, unspecified, unspecified trimester: Principal | ICD-10-CM | POA: Diagnosis present

## 2023-05-13 DIAGNOSIS — O99284 Endocrine, nutritional and metabolic diseases complicating childbirth: Principal | ICD-10-CM | POA: Diagnosis present

## 2023-05-13 DIAGNOSIS — Z412 Encounter for routine and ritual male circumcision: Secondary | ICD-10-CM | POA: Diagnosis not present

## 2023-05-13 DIAGNOSIS — Z87410 Personal history of cervical dysplasia: Secondary | ICD-10-CM

## 2023-05-13 DIAGNOSIS — Z3A40 40 weeks gestation of pregnancy: Secondary | ICD-10-CM | POA: Diagnosis not present

## 2023-05-13 DIAGNOSIS — Z3A39 39 weeks gestation of pregnancy: Secondary | ICD-10-CM | POA: Diagnosis not present

## 2023-05-13 DIAGNOSIS — O26893 Other specified pregnancy related conditions, third trimester: Secondary | ICD-10-CM | POA: Diagnosis present

## 2023-05-13 LAB — TYPE AND SCREEN
ABO/RH(D): O POS
Antibody Screen: NEGATIVE

## 2023-05-13 LAB — RPR: RPR Ser Ql: NONREACTIVE

## 2023-05-13 LAB — CBC
HCT: 35.3 % — ABNORMAL LOW (ref 36.0–46.0)
Hemoglobin: 11.5 g/dL — ABNORMAL LOW (ref 12.0–15.0)
MCH: 26.9 pg (ref 26.0–34.0)
MCHC: 32.6 g/dL (ref 30.0–36.0)
MCV: 82.7 fL (ref 80.0–100.0)
Platelets: 227 10*3/uL (ref 150–400)
RBC: 4.27 MIL/uL (ref 3.87–5.11)
RDW: 14.5 % (ref 11.5–15.5)
WBC: 9.9 10*3/uL (ref 4.0–10.5)
nRBC: 0 % (ref 0.0–0.2)

## 2023-05-13 MED ORDER — OXYTOCIN-SODIUM CHLORIDE 30-0.9 UT/500ML-% IV SOLN
1.0000 m[IU]/min | INTRAVENOUS | Status: DC
  Administered 2023-05-13: 2 m[IU]/min via INTRAVENOUS
  Filled 2023-05-13: qty 500

## 2023-05-13 MED ORDER — LACTATED RINGERS IV SOLN: 500.0000 mL | INTRAVENOUS | Status: DC | PRN

## 2023-05-13 MED ORDER — LIDOCAINE HCL (PF) 1 % IJ SOLN: 30.0000 mL | INTRAMUSCULAR | Status: DC | PRN

## 2023-05-13 MED ORDER — TERBUTALINE SULFATE 1 MG/ML IJ SOLN: 0.2500 mg | Freq: Once | INTRAMUSCULAR | Status: DC | PRN

## 2023-05-13 MED ORDER — LACTATED RINGERS IV SOLN: INTRAVENOUS | Status: DC

## 2023-05-13 MED ORDER — FENTANYL-BUPIVACAINE-NACL 0.5-0.125-0.9 MG/250ML-% EP SOLN
12.0000 mL/h | EPIDURAL | Status: DC | PRN
  Filled 2023-05-13: qty 250

## 2023-05-13 MED ORDER — OXYTOCIN BOLUS FROM INFUSION: 333.0000 mL | Freq: Once | INTRAVENOUS | Status: DC

## 2023-05-13 MED ORDER — ACETAMINOPHEN 325 MG PO TABS: 650.0000 mg | ORAL_TABLET | ORAL | Status: DC | PRN

## 2023-05-13 MED ORDER — ONDANSETRON HCL 4 MG/2ML IJ SOLN: 4.0000 mg | Freq: Four times a day (QID) | INTRAMUSCULAR | Status: DC | PRN

## 2023-05-13 MED ORDER — EPHEDRINE 5 MG/ML INJ: 10.0000 mg | INTRAVENOUS | Status: DC | PRN

## 2023-05-13 MED ORDER — PHENYLEPHRINE 80 MCG/ML (10ML) SYRINGE FOR IV PUSH (FOR BLOOD PRESSURE SUPPORT): 80.0000 ug | PREFILLED_SYRINGE | INTRAVENOUS | Status: DC | PRN

## 2023-05-13 MED ORDER — SOD CITRATE-CITRIC ACID 500-334 MG/5ML PO SOLN: 30.0000 mL | ORAL | Status: DC | PRN

## 2023-05-13 MED ORDER — OXYTOCIN-SODIUM CHLORIDE 30-0.9 UT/500ML-% IV SOLN
2.5000 [IU]/h | INTRAVENOUS | Status: DC
  Filled 2023-05-13: qty 500

## 2023-05-13 MED ORDER — FENTANYL-BUPIVACAINE-NACL 0.5-0.125-0.9 MG/250ML-% EP SOLN
EPIDURAL | Status: DC | PRN
  Administered 2023-05-13: 12 mL/h via EPIDURAL

## 2023-05-13 MED ORDER — LACTATED RINGERS IV SOLN
500.0000 mL | Freq: Once | INTRAVENOUS | Status: AC
  Administered 2023-05-13: 500 mL via INTRAVENOUS

## 2023-05-13 MED ORDER — LIDOCAINE HCL (PF) 1 % IJ SOLN
INTRAMUSCULAR | Status: DC | PRN
  Administered 2023-05-13: 5 mL via EPIDURAL

## 2023-05-13 MED ORDER — DIPHENHYDRAMINE HCL 50 MG/ML IJ SOLN: 12.5000 mg | INTRAMUSCULAR | Status: DC | PRN

## 2023-05-13 NOTE — Progress Notes (Signed)
Brittany Morrow is a 37 y.o. G2P1 at [redacted]w[redacted]d by LMP admitted for induction of labor due to 40w iup.  Subjective: Crampy  Objective: BP 108/63 (BP Location: Left Arm)   Pulse 82   Temp 98 F (36.7 C) (Oral)   Resp 16   Ht 5\' 3"  (1.6 m)   Wt 94.8 kg   SpO2 99%   BMI 37.02 kg/m  No intake/output data recorded. No intake/output data recorded.  FHT:  FHR: 145 bpm, variability: moderate,  accelerations:  Present,  decelerations:  Absent UC:   irregular, every 2-5 minutes SVE:   Dilation: 2.5 Effacement (%): 50 Station: -2 Exam by:: Billy Coast, MD AROM-clear  Labs: Lab Results  Component Value Date   WBC 9.9 05/13/2023   HGB 11.5 (L) 05/13/2023   HCT 35.3 (L) 05/13/2023   MCV 82.7 05/13/2023   PLT 227 05/13/2023    Assessment / Plan: Induction of labor due to 40w IUP,  progressing well on pitocin  Labor: Progressing normally Preeclampsia:  no signs or symptoms of toxicity Fetal Wellbeing:  Category I Pain Control:  Labor support without medications I/D:  n/a Anticipated MOD:  NSVD  Lenoard Aden, MD 05/13/2023, 8:19 AM

## 2023-05-13 NOTE — H&P (Signed)
Brittany Morrow is a 37 y.o. female presenting for IOL at 40 wks. OB History     Gravida  2   Para  1   Term      Preterm      AB      Living  1      SAB      IAB      Ectopic      Multiple      Live Births             Past Medical History:  Diagnosis Date   B12 deficiency    Chlamydia 2002, 2005   Dyshidrotic eczema    Gonorrhea 2014   History of cervical dysplasia 07/31/2012   VAIN I and CIN 1 cured with Laser vaporization of cervix and vagina 2006   Hypothyroidism    Irritable bowel syndrome with both constipation and diarrhea 07/06/2020   Knee pain    Vitamin D deficiency    Vitamin D deficiency    Past Surgical History:  Procedure Laterality Date   ABDOMINOPLASTY     2018   CHOLECYSTECTOMY     LASER VAPORIZATION OF CERVIX AND VAGINA  2008   [facility]--DR FERNANDEZ   REDUCTION MAMMAPLASTY     Family History: family history includes Alcohol abuse in her father; Diabetes in her father and paternal grandmother; Healthy in her daughter; Hyperlipidemia in her mother; Hypertension in her mother. Social History:  reports that she has never smoked. She has never used smokeless tobacco. She reports current alcohol use. She reports that she does not use drugs.     Maternal Diabetes: No Genetic Screening: Normal Maternal Ultrasounds/Referrals: Normal Fetal Ultrasounds or other Referrals:  None Maternal Substance Abuse:  No Significant Maternal Medications:  None Significant Maternal Lab Results:  Group B Strep negative Number of Prenatal Visits:greater than 3 verified prenatal visits Other Comments:  None  Review of Systems  Constitutional: Negative.   All other systems reviewed and are negative.  Maternal Medical History:  Reason for admission: Contractions.   Contractions: Onset was less than 1 hour ago.   Frequency: rare.   Perceived severity is mild.   Fetal activity: Perceived fetal activity is normal.   Last perceived fetal movement was within  the past hour.   Prenatal complications: no prenatal complications Prenatal Complications - Diabetes: none.   Dilation: 1.5 Effacement (%): 50 Station: -3 Exam by:: Jillyn Ledger, RN Blood pressure 109/67, pulse 84, temperature 98.3 F (36.8 C), temperature source Oral, resp. rate 16. Maternal Exam:  Uterine Assessment: Contraction strength is mild.  Contraction frequency is irregular.  Abdomen: Patient reports no abdominal tenderness. Fetal presentation: vertex Introitus: Normal vulva. Normal vagina.  Ferning test: not done.  Nitrazine test: not done. Pelvis: adequate for delivery.   Cervix: Cervix evaluated by digital exam.     Physical Exam Vitals reviewed.  Constitutional:      Appearance: Normal appearance.  HENT:     Head: Normocephalic and atraumatic.  Cardiovascular:     Rate and Rhythm: Normal rate and regular rhythm.  Pulmonary:     Effort: Pulmonary effort is normal.     Breath sounds: Normal breath sounds.  Abdominal:     General: Bowel sounds are normal.     Palpations: Abdomen is soft.  Genitourinary:    General: Normal vulva.  Musculoskeletal:        General: Normal range of motion.     Cervical back: Normal range of motion and  neck supple.  Skin:    General: Skin is warm and dry.  Neurological:     General: No focal deficit present.     Mental Status: She is alert and oriented to person, place, and time.  Psychiatric:        Mood and Affect: Mood normal.        Behavior: Behavior normal.     Prenatal labs: ABO, Rh: --/--/O POS (05/14 0035) Antibody: NEG (05/14 0035) Rubella:   RPR:    HBsAg:    HIV:    GBS: Negative/-- (04/10 0000)   Assessment/Plan: IOL at 40wks Cytotec then pitocin   Chenoah Mcnally J 05/13/2023, 7:25 AM

## 2023-05-13 NOTE — Anesthesia Procedure Notes (Addendum)
Epidural Patient location during procedure: OB Start time: 05/13/2023 12:44 PM End time: 05/13/2023 12:56 PM  Staffing Anesthesiologist: Trevor Iha, MD Performed: anesthesiologist   Preanesthetic Checklist Completed: patient identified, IV checked, site marked, risks and benefits discussed, surgical consent, monitors and equipment checked, pre-op evaluation and timeout performed  Epidural Patient position: sitting Prep: DuraPrep and site prepped and draped Patient monitoring: continuous pulse ox and blood pressure Approach: midline Location: L3-L4 Injection technique: LOR air  Needle:  Needle type: Tuohy  Needle gauge: 17 G Needle length: 9 cm and 9 Needle insertion depth: 6 cm Catheter type: closed end flexible Catheter size: 19 Gauge Catheter at skin depth: 12 cm Test dose: negative  Assessment Events: blood not aspirated, no cerebrospinal fluid, injection not painful, no injection resistance, no paresthesia and negative IV test  Additional Notes Patient identified. Risks/Benefits/Options discussed with patient including but not limited to bleeding, infection, nerve damage, paralysis, failed block, incomplete pain control, headache, blood pressure changes, nausea, vomiting, reactions to medication both or allergic, itching and postpartum back pain. Confirmed with bedside nurse the patient's most recent platelet count. Confirmed with patient that they are not currently taking any anticoagulation, have any bleeding history or any family history of bleeding disorders. Patient expressed understanding and wished to proceed. All questions were answered. Sterile technique was used throughout the entire procedure. Please see nursing notes for vital signs. Test dose was given through epidural needle and negative prior to continuing to dose epidural or start infusion. Warning signs of high block given to the patient including shortness of breath, tingling/numbness in hands, complete  motor block, or any concerning symptoms with instructions to call for help. Patient was given instructions on fall risk and not to get out of bed. All questions and concerns addressed with instructions to call with any issues.  1 Attempt (S) . Patient tolerated procedure well.

## 2023-05-13 NOTE — Plan of Care (Signed)

## 2023-05-13 NOTE — Anesthesia Preprocedure Evaluation (Addendum)
Anesthesia Evaluation  Patient identified by MRN, date of birth, ID band Patient awake    Reviewed: Allergy & Precautions, NPO status , Patient's Chart, lab work & pertinent test results  Airway Mallampati: II  TM Distance: >3 FB Neck ROM: Full    Dental no notable dental hx. (+) Teeth Intact, Dental Advisory Given   Pulmonary neg pulmonary ROS   Pulmonary exam normal breath sounds clear to auscultation       Cardiovascular Normal cardiovascular exam Rhythm:Regular Rate:Normal     Neuro/Psych negative neurological ROS  negative psych ROS   GI/Hepatic negative GI ROS, Neg liver ROS,,,  Endo/Other  Hypothyroidism    Renal/GU      Musculoskeletal   Abdominal  (+) + obese (BMI 37.1)  Peds  Hematology Lab Results      Component                Value               Date                      WBC                      9.9                 05/13/2023                HGB                      11.5 (L)            05/13/2023                HCT                      35.3 (L)            05/13/2023                MCV                      82.7                05/13/2023                PLT                      227                 05/13/2023              Anesthesia Other Findings All Sulfa, tramadol  Reproductive/Obstetrics (+) Pregnancy                             Anesthesia Physical Anesthesia Plan  ASA: 3  Anesthesia Plan: Epidural   Post-op Pain Management:    Induction:   PONV Risk Score and Plan:   Airway Management Planned:   Additional Equipment:   Intra-op Plan:   Post-operative Plan:   Informed Consent: I have reviewed the patients History and Physical, chart, labs and discussed the procedure including the risks, benefits and alternatives for the proposed anesthesia with the patient or authorized representative who has indicated his/her understanding and acceptance.       Plan  Discussed with:   Anesthesia Plan Comments: (39.6 wk G2P1 wBMI  37 and IBS for LEA)       Anesthesia Quick Evaluation

## 2023-05-13 NOTE — Progress Notes (Signed)
Brittany Morrow is a 37 y.o. G2P1 at [redacted]w[redacted]d by LMP admitted for induction of labor due to 40w IOL.  Subjective: pushing  Objective: BP 114/75   Pulse (!) 120   Temp 99.5 F (37.5 C) (Oral)   Resp 16   Ht 5\' 3"  (1.6 m)   Wt 94.8 kg   SpO2 97%   BMI 37.02 kg/m  I/O last 3 completed shifts: In: 2932.6 [I.V.:2932.6] Out: 650 [Urine:650] No intake/output data recorded.  FHT:  FHR: 145 bpm, variability: moderate,  accelerations:  Present,  decelerations:  Absent UC:   regular, every 2-4 minutes SVE:   10//+1  Labs: Lab Results  Component Value Date   WBC 9.9 05/13/2023   HGB 11.5 (L) 05/13/2023   HCT 35.3 (L) 05/13/2023   MCV 82.7 05/13/2023   PLT 227 05/13/2023    Assessment / Plan: 40w IOL on pitocin  Labor: Progressing normally Preeclampsia:  no signs or symptoms of toxicity Fetal Wellbeing:  Category I Pain Control:  Epidural I/D:  n/a Anticipated MOD:  NSVD  Lenoard Aden, MD 05/13/2023, 11:25 PM

## 2023-05-13 NOTE — Progress Notes (Signed)
Brittany Morrow is a 37 y.o. G2P1 at [redacted]w[redacted]d by LMP admitted for induction of labor due to 40w IOL.  Subjective: Comfortable with epidural  Objective: BP 112/71   Pulse 79   Temp 98.2 F (36.8 C) (Oral)   Resp 18   Ht 5\' 3"  (1.6 m)   Wt 94.8 kg   SpO2 97%   BMI 37.02 kg/m  No intake/output data recorded. No intake/output data recorded.  FHT:  FHR: 145 bpm, variability: moderate,  accelerations:  Present,  decelerations:  Absent UC:   regular, every 2-4 minutes SVE:   4/70/-2 IUPC placed without difficulty  Labs: Lab Results  Component Value Date   WBC 9.9 05/13/2023   HGB 11.5 (L) 05/13/2023   HCT 35.3 (L) 05/13/2023   MCV 82.7 05/13/2023   PLT 227 05/13/2023    Assessment / Plan: Induction of labor due to 40w iup,  progressing well on pitocin  Labor: Progressing normally Preeclampsia:  no signs or symptoms of toxicity Fetal Wellbeing:  Category I Pain Control:  Epidural I/D:  n/a Anticipated MOD:  NSVD  Lenoard Aden, MD 05/13/2023, 2:21 PM

## 2023-05-13 NOTE — Progress Notes (Signed)
Discussed pitocin rate with Dr Billy Coast and was directed to leave at current rate of 30 milliunits.

## 2023-05-14 ENCOUNTER — Encounter (HOSPITAL_COMMUNITY): Payer: Self-pay | Admitting: Obstetrics and Gynecology

## 2023-05-14 LAB — CBC
HCT: 32.5 % — ABNORMAL LOW (ref 36.0–46.0)
Hemoglobin: 10.8 g/dL — ABNORMAL LOW (ref 12.0–15.0)
MCH: 27.3 pg (ref 26.0–34.0)
MCHC: 33.2 g/dL (ref 30.0–36.0)
MCV: 82.3 fL (ref 80.0–100.0)
Platelets: 194 10*3/uL (ref 150–400)
RBC: 3.95 MIL/uL (ref 3.87–5.11)
RDW: 14.4 % (ref 11.5–15.5)
WBC: 16.6 10*3/uL — ABNORMAL HIGH (ref 4.0–10.5)
nRBC: 0 % (ref 0.0–0.2)

## 2023-05-14 MED ORDER — ACETAMINOPHEN 325 MG PO TABS: 650.0000 mg | ORAL_TABLET | ORAL | Status: DC | PRN

## 2023-05-14 MED ORDER — BENZOCAINE-MENTHOL 20-0.5 % EX AERO
1.0000 | INHALATION_SPRAY | CUTANEOUS | Status: DC | PRN
  Administered 2023-05-14: 1 via TOPICAL
  Filled 2023-05-14: qty 56

## 2023-05-14 MED ORDER — ONDANSETRON HCL 4 MG PO TABS: 4.0000 mg | ORAL_TABLET | ORAL | Status: DC | PRN

## 2023-05-14 MED ORDER — METHYLERGONOVINE MALEATE 0.2 MG PO TABS: 0.2000 mg | ORAL_TABLET | ORAL | Status: DC | PRN

## 2023-05-14 MED ORDER — IBUPROFEN 600 MG PO TABS
600.0000 mg | ORAL_TABLET | Freq: Four times a day (QID) | ORAL | Status: DC
  Administered 2023-05-14 – 2023-05-15 (×4): 600 mg via ORAL
  Filled 2023-05-14 (×6): qty 1

## 2023-05-14 MED ORDER — PRENATAL MULTIVITAMIN CH
1.0000 | ORAL_TABLET | Freq: Every day | ORAL | Status: DC
  Administered 2023-05-14: 1 via ORAL
  Filled 2023-05-14: qty 1

## 2023-05-14 MED ORDER — COCONUT OIL OIL: 1.0000 | TOPICAL_OIL | Status: DC | PRN

## 2023-05-14 MED ORDER — DIPHENHYDRAMINE HCL 25 MG PO CAPS: 25.0000 mg | ORAL_CAPSULE | Freq: Four times a day (QID) | ORAL | Status: DC | PRN

## 2023-05-14 MED ORDER — SIMETHICONE 80 MG PO CHEW: 80.0000 mg | CHEWABLE_TABLET | ORAL | Status: DC | PRN

## 2023-05-14 MED ORDER — LEVOTHYROXINE SODIUM 88 MCG PO TABS
88.0000 ug | ORAL_TABLET | Freq: Every day | ORAL | Status: DC
  Administered 2023-05-15: 88 ug via ORAL
  Filled 2023-05-14 (×2): qty 1

## 2023-05-14 MED ORDER — ALBUTEROL SULFATE (2.5 MG/3ML) 0.083% IN NEBU: 3.0000 mL | INHALATION_SOLUTION | Freq: Four times a day (QID) | RESPIRATORY_TRACT | Status: DC | PRN

## 2023-05-14 MED ORDER — METHYLERGONOVINE MALEATE 0.2 MG/ML IJ SOLN: 0.2000 mg | INTRAMUSCULAR | Status: DC | PRN

## 2023-05-14 MED ORDER — WITCH HAZEL-GLYCERIN EX PADS: 1.0000 | MEDICATED_PAD | CUTANEOUS | Status: DC | PRN

## 2023-05-14 MED ORDER — ZOLPIDEM TARTRATE 5 MG PO TABS: 5.0000 mg | ORAL_TABLET | Freq: Every evening | ORAL | Status: DC | PRN

## 2023-05-14 MED ORDER — DIBUCAINE (PERIANAL) 1 % EX OINT: 1.0000 | TOPICAL_OINTMENT | CUTANEOUS | Status: DC | PRN

## 2023-05-14 MED ORDER — ONDANSETRON HCL 4 MG/2ML IJ SOLN: 4.0000 mg | INTRAMUSCULAR | Status: DC | PRN

## 2023-05-14 MED ORDER — TETANUS-DIPHTH-ACELL PERTUSSIS 5-2.5-18.5 LF-MCG/0.5 IM SUSY: 0.5000 mL | PREFILLED_SYRINGE | Freq: Once | INTRAMUSCULAR | Status: DC

## 2023-05-14 MED ORDER — SENNOSIDES-DOCUSATE SODIUM 8.6-50 MG PO TABS
2.0000 | ORAL_TABLET | Freq: Every day | ORAL | Status: DC
  Administered 2023-05-14: 2 via ORAL
  Filled 2023-05-14 (×2): qty 2

## 2023-05-14 MED ORDER — FLUTICASONE PROPIONATE 50 MCG/ACT NA SUSP
1.0000 | Freq: Every day | NASAL | Status: DC
  Administered 2023-05-14: 1 via NASAL
  Filled 2023-05-14: qty 16

## 2023-05-14 NOTE — Progress Notes (Signed)
       PPD # 1 S/P NSVD  Live born female  Birth Weight: 7 lb 13.9 oz (3570 g) APGAR: 9, 9  Newborn Delivery   Birth date/time: 05/[birth year]/2023 23:43:00 Delivery type: Vaginal, Spontaneous     Baby name: Brittany Morrow Delivering provider: Olivia Mackie  Episiotomy:None   Lacerations:None   circumcision planned  Feeding: breast  Pain control at delivery: Epidural   S:  Reports feeling well but tired.              Tolerating po/ No nausea or vomiting             Bleeding is light             Pain controlled with acetaminophen and ibuprofen (OTC)             Up ad lib / ambulatory / voiding without difficulties   O:  A & O x 3, in no apparent distress              VS:  Vitals:   05/14/23 0200 05/14/23 0315 05/14/23 0632 05/14/23 1005  BP: 110/75 117/74 109/65 105/68  Pulse: (!) 114 96 89 95  Resp: 18 18 18 16   Temp: 98.6 F (37 C) 98.4 F (36.9 C) 98.4 F (36.9 C) 98.4 F (36.9 C)  TempSrc: Oral Oral  Oral  SpO2: 96% 98% 99% 98%  Weight:      Height:        LABS:  Recent Labs    05/13/23 0039 05/14/23 0545  WBC 9.9 16.6*  HGB 11.5* 10.8*  HCT 35.3* 32.5*  PLT 227 194    Blood type: --/--/O POS (05/14 0035)  Rubella:   immune 10/28/2022 per transfer records  I&O: I/O last 3 completed shifts: In: 2932.6 [I.V.:2932.6] Out: 1547 [Urine:1350; Blood:197]          No intake/output data recorded.    Gen: AAO x 3, NAD  Abdomen: soft, non-tender, non-distended             Fundus: firm, non-tender, U-1  Perineum: intact, no edema  Lochia: small  Extremities: no edema, no calf pain or tenderness    A/P: PPD # 1 37 y.o., [id number]   Principal Problem:   Postpartum care following vaginal delivery 5/14 Active Problems:   Acquired hypothyroidism  - stable on synthroid 88 mcg. Continue as established   Encounter for induction of labor   SVD (spontaneous vaginal delivery)   Doing well - stable status  Routine post partum orders  Anticipate discharge  tomorrow    Neta Mends, MSN, CNM 05/14/2023, 10:35 AM

## 2023-05-14 NOTE — Anesthesia Postprocedure Evaluation (Signed)
Anesthesia Post Note  Patient: Brittany Morrow  Procedure(s) Performed: AN AD HOC LABOR EPIDURAL     Patient location during evaluation: Mother Baby Anesthesia Type: Epidural Level of consciousness: awake, oriented and awake and alert Pain management: pain level controlled Vital Signs Assessment: post-procedure vital signs reviewed and stable Respiratory status: spontaneous breathing, respiratory function stable and nonlabored ventilation Cardiovascular status: stable Postop Assessment: no headache, adequate PO intake, able to ambulate, no apparent nausea or vomiting and patient able to bend at knees Anesthetic complications: no   No notable events documented.  Last Vitals:  Vitals:   05/14/23 0315 05/14/23 0632  BP: 117/74 109/65  Pulse: 96 89  Resp: 18 18  Temp: 36.9 C 36.9 C  SpO2: 98% 99%    Last Pain:  Vitals:   05/14/23 0633  TempSrc:   PainSc: 0-No pain   Pain Goal:                   Tc [place]

## 2023-05-14 NOTE — Lactation Note (Signed)
This note was copied from a baby's chart. Lactation Consultation Note  Patient Name: Boy Nahla Bargman [id number] Date: 05/14/2023 Age:37 hours, Exp BF 18 years ago ,  Reason for consult: Maternal endocrine disorder;Initial assessment;Term Per mom baby recently fed formula.  LC reviewed the doc flow sheets with mom and updated.  Baby has been to the breast x 2 20 and 10 mins and has had 4 bottles 5 - 30 ml. 2 wets and 2 stools LC reviewed supply and demand, importance of giving the baby practice at the breast to enhance establish the milk supply. LC recommended if the baby is still hungry after the 1st breast offer the 2nd breast, if satisfied, hold off on supplementing.  LC encouraged mom to call with feeding cues for latch assessment.   Maternal Data Does the patient have breastfeeding experience prior to this delivery?: Yes How long did the patient breastfeed?: per mom 6 months with her 1st , 18 years ago  Feeding Mother's Current Feeding Choice: Breast Milk and Formula Nipple Type: Slow - flow  LATCH Score -8       Interventions Interventions: Education;LC Services brochure;Breast feeding basics reviewed  Discharge Pump: Personal;DEBP  Consult Status Consult Status: Follow-up Date: 05/14/23 Follow-up type: In-patient    Matilde Sprang Ozan Maclay 05/14/2023, 11:34 AM

## 2023-05-15 ENCOUNTER — Other Ambulatory Visit (HOSPITAL_COMMUNITY): Payer: Self-pay

## 2023-05-15 MED ORDER — ACETAMINOPHEN 325 MG PO TABS: 650.0000 mg | ORAL_TABLET | ORAL | Status: DC | PRN

## 2023-05-15 MED ORDER — IBUPROFEN 600 MG PO TABS
600.0000 mg | ORAL_TABLET | Freq: Four times a day (QID) | ORAL | 0 refills | Status: DC
  Filled 2023-05-15: qty 30, 8d supply, fill #0

## 2023-05-15 MED ORDER — COCONUT OIL OIL: 1.0000 | TOPICAL_OIL | 0 refills | Status: DC | PRN

## 2023-05-15 MED ORDER — BENZOCAINE-MENTHOL 20-0.5 % EX AERO: 1.0000 | INHALATION_SPRAY | CUTANEOUS | Status: DC | PRN

## 2023-05-15 NOTE — Discharge Summary (Signed)
OB Discharge Summary  Patient Name: Brittany Morrow DOB: 1986-02-28 MRN: [id number]  Date of admission: 05/13/2023 Delivering provider: Olivia Mackie   Admitting diagnosis: Encounter for induction of labor [Z34.90] Intrauterine pregnancy: [redacted]w[redacted]d     Secondary diagnosis: Patient Active Problem List   Diagnosis Date Noted   Postpartum care following vaginal delivery 5/14 05/14/2023   SVD (spontaneous vaginal delivery) 05/14/2023   Encounter for induction of labor 05/13/2023   Pregnancy 01/01/2023   B12 deficiency due to diet 07/06/2020   Irritable bowel syndrome with both constipation and diarrhea 07/06/2020   Obesity (BMI 30.0-34.9) 03/11/2017   Acquired hypothyroidism 12/27/2015   History of cervical dysplasia 07/31/2012   Additional problems:none   Date of discharge: 05/15/2023   Discharge diagnosis: Principal Problem:   Postpartum care following vaginal delivery 5/14 Active Problems:   Acquired hypothyroidism   Encounter for induction of labor   SVD (spontaneous vaginal delivery)                                                              Post partum procedures: none  Augmentation: AROM and Pitocin Pain control: Epidural  Laceration:None  Episiotomy:None  Complications: None  Hospital course:  Induction of Labor With Vaginal Delivery   37 y.o. yo G2P1002 at [redacted]w[redacted]d was admitted to the hospital 05/13/2023 for induction of labor.  Indication for induction: Elective.  Patient had an labor course complicated by none Membrane Rupture Time/Date: 8:14 AM ,05/13/2023   Delivery Method:Vaginal, Spontaneous  Episiotomy: None  Lacerations:  None  Details of delivery can be found in separate delivery note.  Patient had a postpartum course complicated by none. Patient is discharged home 05/17/23.  Newborn Data: Birth date:05/13/2023  Birth time:11:43 PM  Gender:Female  Living status:Living  Apgars:9 ,9  Weight:3570 g   Physical exam  Vitals:   05/14/23 1005 05/14/23  1446 05/14/23 2053 05/15/23 0445  BP: 105/68 106/74 102/73 100/75  Pulse: 95 86 85 75  Resp: 16 16 16 16   Temp: 98.4 F (36.9 C) 98.2 F (36.8 C) 98.4 F (36.9 C) 98.2 F (36.8 C)  TempSrc: Oral Oral Oral Oral  SpO2: 98% 98% 97% 98%  Weight:      Height:       General: alert, cooperative, and no distress Lochia: appropriate Uterine Fundus: firm Incision: N/A Perineum: intact, no edema DVT Evaluation: No cords or calf tenderness. No significant calf/ankle edema. Labs: Lab Results  Component Value Date   WBC 16.6 (H) 05/14/2023   HGB 10.8 (L) 05/14/2023   HCT 32.5 (L) 05/14/2023   MCV 82.3 05/14/2023   PLT 194 05/14/2023      Latest Ref Rng & Units 01/01/2023    3:02 PM  CMP  Glucose 70 - 99 mg/dL 664   BUN 6 - 23 mg/dL 6   Creatinine 4.03 - 4.74 mg/dL 2.59   Sodium 563 - 875 mEq/L 138   Potassium 3.5 - 5.1 mEq/L 3.7   Chloride 96 - 112 mEq/L 105   CO2 19 - 32 mEq/L 25   Calcium 8.4 - 10.5 mg/dL 8.9       6/43/3295    5:25 PM  Edinburgh Postnatal Depression Scale Screening Tool  I have been able to laugh and see the funny side  of things. 0  I have looked forward with enjoyment to things. 0  I have blamed myself unnecessarily when things went wrong. 1  I have been anxious or worried for no good reason. 1  I have felt scared or panicky for no good reason. 0  Things have been getting on top of me. 0  I have been so unhappy that I have had difficulty sleeping. 1  I have felt sad or miserable. 0  I have been so unhappy that I have been crying. 0  The thought of harming myself has occurred to me. 0  Edinburgh Postnatal Depression Scale Total 3   Discharge instruction:  per After Visit Summary,  [facility] OB booklet and  "Understanding Mother & Baby Care" hospital booklet After Visit Meds:  Allergies as of 05/15/2023       Reactions   Sulfa Antibiotics Palpitations   Tramadol Itching        Medication List     TAKE these medications    acetaminophen  325 MG tablet Commonly known as: Tylenol Take 2 tablets (650 mg total) by mouth every 4 (four) hours as needed (for pain scale < 4).   albuterol 108 (90 Base) MCG/ACT inhaler Commonly known as: VENTOLIN HFA Inhale 1 puff into the lungs every 6 (six) hours as needed for wheezing or shortness of breath.   benzocaine-Menthol 20-0.5 % Aero Commonly known as: DERMOPLAST Apply 1 Application topically as needed for irritation (perineal discomfort).   budesonide 32 MCG/ACT nasal spray Commonly known as: RHINOCORT AQUA Place 1 spray into both nostrils in the morning and at bedtime.   coconut oil Oil Apply 1 Application topically as needed.   fluticasone 50 MCG/ACT nasal spray Commonly known as: FLONASE Place 2 sprays into both nostrils daily.   ibuprofen 600 MG tablet Commonly known as: ADVIL Take 1 tablet (600 mg total) by mouth every 6 (six) hours.   levothyroxine 88 MCG tablet Commonly known as: SYNTHROID Take 1 tablet (88 mcg total) by mouth daily.   multivitamin tablet Take 1 tablet by mouth daily.   VITAMIN D3 PO Take by mouth daily.               Discharge Care Instructions  (From admission, onward)           Start     Ordered   05/15/23 0000  Discharge wound care:       Comments: Sitz baths 2 times /day with warm water x 1 week. May add herbals: 1 ounce dried comfrey leaf* 1 ounce calendula flowers 1 ounce lavender flowers  Supplies can be found online at [employer] sources at [employer], [employer]  1/2 ounce dried uva ursi leaves 1/2 ounce witch hazel blossoms (if you can find them) 1/2 ounce dried sage leaf 1/2 cup sea salt Directions: Bring 2 quarts of water to a boil. Turn off heat, and place 1 ounce (approximately 1 large handful) of the above mixed herbs (not the salt) into the pot. Steep, covered, for 30 minutes.  Strain the liquid well with a fine mesh strainer, and discard the herb material. Add 2 quarts of liquid to the tub,  along with the 1/2 cup of salt. This medicinal liquid can also be made into compresses and peri-rinses.   05/15/23 1000           Diet: routine diet Activity: Advance as tolerated. Pelvic rest for 6 weeks.  Postpartum contraception: TBA in office Newborn Data:  Live born female  Birth Weight: 7 lb 13.9 oz (3570 g) APGAR: 9, 9  Newborn Delivery   Birth date/time: 05/13/2023 23:43:00 Delivery type: Vaginal, Spontaneous      named Amari Baby Feeding: Breast Disposition:home with mother Circumcision: completed / Dr. Billy Coast Delivery Report: Review the Delivery Report for details.   Follow up:  Follow-up Information     Obgyn, [facility] [employer] [facility]. Schedule an appointment as soon as possible for a visit in 6 week(s).   Why: For Postpartum follow-up Contact information: [address] [place] [address] [phone]                Signed: Neta Mends, CNM, MSN 05/15/2023, 10:00 AM

## 2023-05-15 NOTE — Lactation Note (Signed)
This note was copied from a baby's chart. Lactation Consultation Note  Patient Name: Boy Shalayah Nicotera [id number] Date: 05/15/2023 Age:37 hours Reason for consult: Follow-up assessment  P2, Baby latched when LC entered room in side lying position.  Intermittent swallows observed.  Mother has been supplementing with formula due to concerns regarding her milk supply.  Encouraged mother to offer breast before formula to help establish her supply.  Discussed supply and demand.  Feed on demand with cues.  Goal 8-12+ times per day after first 24 hrs. Reviewed engorgement care and monitoring voids/stools.   Maternal Data Does the patient have breastfeeding experience prior to this delivery?: Yes  Feeding Mother's Current Feeding Choice: Breast Milk and Formula Nipple Type: Slow - flow  LATCH Score Latch: Grasps breast easily, tongue down, lips flanged, rhythmical sucking.  Audible Swallowing: A few with stimulation  Type of Nipple: Everted at rest and after stimulation  Comfort (Breast/Nipple): Soft / non-tender  Hold (Positioning): No assistance needed to correctly position infant at breast.  LATCH Score: 9  Interventions Interventions: Breast feeding basics reviewed;Education  Discharge Discharge Education: Engorgement and breast care;Warning signs for feeding baby  Consult Status Consult Status: Complete Date: 05/15/23    Dahlia [facility] 05/15/2023, 7:32 AM

## 2023-05-15 NOTE — Discharge Instructions (Signed)
Lactation outpatient support - home visit   Jessica Bowers, IBCLC (lactation consultant)  & Birth Doula  Phone (text or call): [phone] Email: [email or web address]   Linda Coppola RN, MHA, IBCLC at [facility]: Lactation Consultant  [email or web address] Mail: [email or web address] Tel: [phone]   Additional breastfeeding resources:  [facility] [email or web address]://ibconline.ca/information-[email or web address]  [facility] [place]  www.[email or web address]   Other Resources:  Chiropractic specialist   Dr. Leanna Hastings [email or web address]   Craniosacral therapy for baby  Erin Balkind  [email or web address]  Pediatric Dentist (for tongue ties)  Dr. Kate Lambert Spangler, [facility]  Phone: [phone]  [address] [place] [address] [email or web address]  

## 2023-05-22 ENCOUNTER — Telehealth (HOSPITAL_COMMUNITY): Payer: Self-pay | Admitting: *Deleted

## 2023-05-22 NOTE — Telephone Encounter (Signed)
Hospital Discharge Follow-Up Call:  Patient reports that she is well.  She said she is still bleeding during this second week postpartum and wonders if that is normal.  We reviewed normals for lochia and when to call provider.  She reports not needing to change pads every hour nor passing clots golf ball sized or larger.  Denies headaches or dizziness.  EPDS today was 1 and she endorses this accurately reflects that she is doing well emotionally.  Patient says that baby is well and she has no concerns about baby's health.  Reviewed ABCs of Safe Sleep.

## 2023-05-23 ENCOUNTER — Other Ambulatory Visit (HOSPITAL_COMMUNITY): Payer: Self-pay

## 2023-06-03 ENCOUNTER — Other Ambulatory Visit (HOSPITAL_COMMUNITY): Payer: Self-pay

## 2023-06-03 ENCOUNTER — Encounter: Payer: Self-pay | Admitting: Nurse Practitioner

## 2023-06-03 MED ORDER — CLOBETASOL PROPIONATE E 0.05 % EX CREA
1.0000 | TOPICAL_CREAM | Freq: Two times a day (BID) | CUTANEOUS | 0 refills | Status: AC
  Filled 2023-06-03: qty 30, 30d supply, fill #0

## 2023-06-04 ENCOUNTER — Other Ambulatory Visit (HOSPITAL_COMMUNITY): Payer: Self-pay

## 2023-06-25 ENCOUNTER — Other Ambulatory Visit: Payer: Self-pay

## 2023-06-25 ENCOUNTER — Other Ambulatory Visit (HOSPITAL_COMMUNITY): Payer: Self-pay

## 2023-06-25 DIAGNOSIS — Z124 Encounter for screening for malignant neoplasm of cervix: Secondary | ICD-10-CM | POA: Diagnosis not present

## 2023-06-25 LAB — HM PAP SMEAR: HM Pap smear: NORMAL

## 2023-06-25 MED ORDER — NORETHIN ACE-ETH ESTRAD-FE 1-20 MG-MCG PO TABS
1.0000 | ORAL_TABLET | Freq: Every day | ORAL | 3 refills | Status: DC
  Filled 2023-06-25: qty 84, 84d supply, fill #0

## 2023-06-27 ENCOUNTER — Encounter (HOSPITAL_BASED_OUTPATIENT_CLINIC_OR_DEPARTMENT_OTHER): Payer: Self-pay | Admitting: Obstetrics and Gynecology

## 2023-06-27 ENCOUNTER — Other Ambulatory Visit: Payer: Self-pay | Admitting: Obstetrics and Gynecology

## 2023-06-27 NOTE — Progress Notes (Signed)
Spoke w/ via phone for pre-op interview--- pt Lab needs dos---- urine preg              Lab results------ no COVID test -----patient states asymptomatic no test needed Arrive at ------- 0745 on 07-09-2023 NPO after MN NO Solid Food.  Clear liquids from MN until--- 0645 Med rec completed Medications to take morning of surgery ----- junel, synthroid Diabetic medication ----- n/a Patient instructed no nail polish to be worn day of surgery Patient instructed to bring photo id and insurance card day of surgery Patient aware to have Driver (ride ) / caregiver    for 24 hours after surgery -- mother, Brittany Morrow Patient Special Instructions ----- asked to bring rescue inhaler dos Pre-Op special Instructions ----- case just added on, pre-op orders pending Patient verbalized understanding of instructions that were given at this phone interview. Patient denies shortness of breath, chest pain, fever, cough at this phone interview.

## 2023-07-09 ENCOUNTER — Encounter (HOSPITAL_BASED_OUTPATIENT_CLINIC_OR_DEPARTMENT_OTHER): Payer: Self-pay | Admitting: Obstetrics and Gynecology

## 2023-07-09 ENCOUNTER — Other Ambulatory Visit: Payer: Self-pay

## 2023-07-09 ENCOUNTER — Ambulatory Visit (HOSPITAL_BASED_OUTPATIENT_CLINIC_OR_DEPARTMENT_OTHER): Payer: 59 | Admitting: Anesthesiology

## 2023-07-09 ENCOUNTER — Other Ambulatory Visit (HOSPITAL_COMMUNITY): Payer: Self-pay

## 2023-07-09 ENCOUNTER — Encounter (HOSPITAL_BASED_OUTPATIENT_CLINIC_OR_DEPARTMENT_OTHER)
Admission: RE | Disposition: A | Discharge status: Home / Self Care | Payer: Self-pay | Source: Home / Self Care | Attending: Obstetrics and Gynecology

## 2023-07-09 ENCOUNTER — Ambulatory Visit (HOSPITAL_BASED_OUTPATIENT_CLINIC_OR_DEPARTMENT_OTHER)
Admission: RE | Admit: 2023-07-09 | Discharge: 2023-07-09 | Disposition: A | Discharge status: Home / Self Care | Payer: 59 | Attending: Obstetrics and Gynecology | Admitting: Obstetrics and Gynecology

## 2023-07-09 DIAGNOSIS — Z302 Encounter for sterilization: Secondary | ICD-10-CM | POA: Insufficient documentation

## 2023-07-09 DIAGNOSIS — Z01818 Encounter for other preprocedural examination: Secondary | ICD-10-CM

## 2023-07-09 HISTORY — DX: Other allergic rhinitis: J30.89

## 2023-07-09 HISTORY — DX: Other seasonal allergic rhinitis: J30.2

## 2023-07-09 HISTORY — DX: Personal history of other infectious and parasitic diseases: Z86.19

## 2023-07-09 HISTORY — PX: LAPAROSCOPIC TUBAL LIGATION: SHX1937

## 2023-07-09 HISTORY — DX: Family history of other specified conditions: Z84.89

## 2023-07-09 HISTORY — DX: Personal history of vaginal dysplasia: Z87.411

## 2023-07-09 HISTORY — DX: Anemia, unspecified: D64.9

## 2023-07-09 LAB — CBC
HCT: 41.6 % (ref 36.0–46.0)
Hemoglobin: 13.5 g/dL (ref 12.0–15.0)
MCH: 28 pg (ref 26.0–34.0)
MCHC: 32.5 g/dL (ref 30.0–36.0)
MCV: 86.1 fL (ref 80.0–100.0)
Platelets: 248 10*3/uL (ref 150–400)
RBC: 4.83 MIL/uL (ref 3.87–5.11)
RDW: 15 % (ref 11.5–15.5)
WBC: 7.6 10*3/uL (ref 4.0–10.5)
nRBC: 0 % (ref 0.0–0.2)

## 2023-07-09 LAB — TYPE AND SCREEN
ABO/RH(D): O POS
Antibody Screen: NEGATIVE

## 2023-07-09 LAB — POCT PREGNANCY, URINE: Preg Test, Ur: NEGATIVE

## 2023-07-09 SURGERY — LIGATION, FALLOPIAN TUBE, LAPAROSCOPIC
Anesthesia: General | Laterality: Bilateral

## 2023-07-09 MED ORDER — CEFAZOLIN SODIUM-DEXTROSE 2-4 GM/100ML-% IV SOLN
INTRAVENOUS | Status: AC
  Filled 2023-07-09: qty 100

## 2023-07-09 MED ORDER — LIDOCAINE HCL (PF) 2 % IJ SOLN
INTRAMUSCULAR | Status: AC
  Filled 2023-07-09: qty 5

## 2023-07-09 MED ORDER — CEFAZOLIN SODIUM-DEXTROSE 2-4 GM/100ML-% IV SOLN
2.0000 g | INTRAVENOUS | Status: AC
  Administered 2023-07-09: 2 g via INTRAVENOUS

## 2023-07-09 MED ORDER — SUGAMMADEX SODIUM 200 MG/2ML IV SOLN
INTRAVENOUS | Status: DC | PRN
  Administered 2023-07-09: 200 mg via INTRAVENOUS

## 2023-07-09 MED ORDER — FENTANYL CITRATE (PF) 100 MCG/2ML IJ SOLN
INTRAMUSCULAR | Status: AC
  Filled 2023-07-09: qty 2

## 2023-07-09 MED ORDER — GLYCOPYRROLATE 0.2 MG/ML IJ SOLN
INTRAMUSCULAR | Status: DC | PRN
  Administered 2023-07-09: .1 mg via INTRAVENOUS

## 2023-07-09 MED ORDER — KETOROLAC TROMETHAMINE 30 MG/ML IJ SOLN
INTRAMUSCULAR | Status: DC | PRN
  Administered 2023-07-09: 30 mg via INTRAVENOUS

## 2023-07-09 MED ORDER — ROCURONIUM BROMIDE 10 MG/ML (PF) SYRINGE
PREFILLED_SYRINGE | INTRAVENOUS | Status: AC
  Filled 2023-07-09: qty 10

## 2023-07-09 MED ORDER — 0.9 % SODIUM CHLORIDE (POUR BTL) OPTIME
TOPICAL | Status: DC | PRN
  Administered 2023-07-09: 500 mL

## 2023-07-09 MED ORDER — FENTANYL CITRATE (PF) 100 MCG/2ML IJ SOLN: 25.0000 ug | INTRAMUSCULAR | Status: DC | PRN

## 2023-07-09 MED ORDER — ACETAMINOPHEN 500 MG PO TABS
1000.0000 mg | ORAL_TABLET | Freq: Once | ORAL | Status: AC
  Administered 2023-07-09: 1000 mg via ORAL

## 2023-07-09 MED ORDER — AMISULPRIDE (ANTIEMETIC) 5 MG/2ML IV SOLN: 10.0000 mg | Freq: Once | INTRAVENOUS | Status: DC | PRN

## 2023-07-09 MED ORDER — PROPOFOL 10 MG/ML IV BOLUS
INTRAVENOUS | Status: DC | PRN
  Administered 2023-07-09: 200 mg via INTRAVENOUS

## 2023-07-09 MED ORDER — DEXAMETHASONE SODIUM PHOSPHATE 4 MG/ML IJ SOLN
INTRAMUSCULAR | Status: DC | PRN
  Administered 2023-07-09: 5 mg via INTRAVENOUS

## 2023-07-09 MED ORDER — OXYCODONE HCL 5 MG PO TABS
5.0000 mg | ORAL_TABLET | Freq: Four times a day (QID) | ORAL | 0 refills | Status: DC | PRN
  Filled 2023-07-09: qty 20, 5d supply, fill #0

## 2023-07-09 MED ORDER — ACETAMINOPHEN 500 MG PO TABS
ORAL_TABLET | ORAL | Status: AC
  Filled 2023-07-09: qty 2

## 2023-07-09 MED ORDER — DEXAMETHASONE SODIUM PHOSPHATE 10 MG/ML IJ SOLN
INTRAMUSCULAR | Status: AC
  Filled 2023-07-09: qty 1

## 2023-07-09 MED ORDER — OXYCODONE HCL 5 MG PO TABS
5.0000 mg | ORAL_TABLET | Freq: Four times a day (QID) | ORAL | 0 refills | Status: DC | PRN
Start: 2023-07-09 — End: 2024-04-13

## 2023-07-09 MED ORDER — POVIDONE-IODINE 10 % EX SWAB: 2.0000 | Freq: Once | CUTANEOUS | Status: DC

## 2023-07-09 MED ORDER — ROCURONIUM BROMIDE 100 MG/10ML IV SOLN
INTRAVENOUS | Status: DC | PRN
  Administered 2023-07-09: 60 mg via INTRAVENOUS

## 2023-07-09 MED ORDER — MIDAZOLAM HCL 5 MG/5ML IJ SOLN
INTRAMUSCULAR | Status: DC | PRN
  Administered 2023-07-09: 2 mg via INTRAVENOUS

## 2023-07-09 MED ORDER — BUPIVACAINE HCL (PF) 0.25 % IJ SOLN
INTRAMUSCULAR | Status: DC | PRN
  Administered 2023-07-09: 20 mL

## 2023-07-09 MED ORDER — MIDAZOLAM HCL 2 MG/2ML IJ SOLN
INTRAMUSCULAR | Status: AC
  Filled 2023-07-09: qty 2

## 2023-07-09 MED ORDER — LIDOCAINE HCL (CARDIAC) PF 100 MG/5ML IV SOSY
PREFILLED_SYRINGE | INTRAVENOUS | Status: DC | PRN
  Administered 2023-07-09: 60 mg via INTRAVENOUS

## 2023-07-09 MED ORDER — FENTANYL CITRATE (PF) 100 MCG/2ML IJ SOLN
INTRAMUSCULAR | Status: DC | PRN
  Administered 2023-07-09 (×2): 100 ug via INTRAVENOUS

## 2023-07-09 MED ORDER — ONDANSETRON HCL 4 MG/2ML IJ SOLN
INTRAMUSCULAR | Status: DC | PRN
  Administered 2023-07-09: 4 mg via INTRAVENOUS

## 2023-07-09 MED ORDER — LACTATED RINGERS IV SOLN: INTRAVENOUS | Status: DC

## 2023-07-09 MED ORDER — OXYCODONE HCL 5 MG PO TABS
5.0000 mg | ORAL_TABLET | Freq: Once | ORAL | Status: AC
  Administered 2023-07-09: 5 mg via ORAL

## 2023-07-09 MED ORDER — OXYCODONE HCL 5 MG PO TABS
ORAL_TABLET | ORAL | Status: AC
  Filled 2023-07-09: qty 1

## 2023-07-09 MED ORDER — GLYCOPYRROLATE PF 0.2 MG/ML IJ SOSY
PREFILLED_SYRINGE | INTRAMUSCULAR | Status: AC
  Filled 2023-07-09: qty 1

## 2023-07-09 MED ORDER — ONDANSETRON HCL 4 MG/2ML IJ SOLN
INTRAMUSCULAR | Status: AC
  Filled 2023-07-09: qty 2

## 2023-07-09 SURGICAL SUPPLY — 32 items
ADH SKN CLS APL DERMABOND .7 (GAUZE/BANDAGES/DRESSINGS) ×1
CATH ROBINSON RED A/P 16FR (CATHETERS) ×2 IMPLANT
DERMABOND ADVANCED .7 DNX12 (GAUZE/BANDAGES/DRESSINGS) ×2 IMPLANT
DRAPE SURG IRRIG POUCH 19X23 (DRAPES) ×2 IMPLANT
DRSG OPSITE POSTOP 3X4 (GAUZE/BANDAGES/DRESSINGS) IMPLANT
DURAPREP 26ML APPLICATOR (WOUND CARE) ×2 IMPLANT
GAUZE 4X4 16PLY ~~LOC~~+RFID DBL (SPONGE) ×2 IMPLANT
GLOVE BIO SURGEON STRL SZ7.5 (GLOVE) ×4 IMPLANT
GLOVE BIOGEL PI IND STRL 7.0 (GLOVE) ×4 IMPLANT
GOWN STRL REUS W/TWL LRG LVL3 (GOWN DISPOSABLE) ×4 IMPLANT
IRRIG SUCT STRYKERFLOW 2 WTIP (MISCELLANEOUS)
IRRIGATION SUCT STRKRFLW 2 WTP (MISCELLANEOUS) ×2 IMPLANT
KIT PINK PAD W/HEAD ARE REST (MISCELLANEOUS) ×1
KIT PINK PAD W/HEAD ARM REST (MISCELLANEOUS) ×2 IMPLANT
KIT TURNOVER CYSTO (KITS) ×2 IMPLANT
NDL INSUFFLATION 14GA 120MM (NEEDLE) ×2 IMPLANT
NEEDLE INSUFFLATION 14GA 120MM (NEEDLE) ×1 IMPLANT
NS IRRIG 500ML POUR BTL (IV SOLUTION) IMPLANT
PACK LAPAROSCOPY BASIN (CUSTOM PROCEDURE TRAY) ×2 IMPLANT
PROTECTOR NERVE ULNAR (MISCELLANEOUS) ×4 IMPLANT
SCISSORS LAP 5X45 EPIX DISP (ENDOMECHANICALS) IMPLANT
SCRUB CHG 4% DYNA-HEX 4OZ (MISCELLANEOUS) ×2 IMPLANT
SET TUBE SMOKE EVAC HIGH FLOW (TUBING) ×2 IMPLANT
SLEEVE SCD COMPRESS KNEE MED (STOCKING) ×2 IMPLANT
SOL ELECTROSURG ANTI STICK (MISCELLANEOUS)
SOLUTION ELECTROSURG ANTI STCK (MISCELLANEOUS) ×2 IMPLANT
SUT VIC AB 4-0 PS2 18 (SUTURE) IMPLANT
SUT VICRYL 0 UR6 27IN ABS (SUTURE) ×2 IMPLANT
TOWEL OR 17X24 6PK STRL BLUE (TOWEL DISPOSABLE) ×2 IMPLANT
TROCAR Z-THREAD BLADED 11X100M (TROCAR) ×2 IMPLANT
TROCAR Z-THREAD BLADED 5X100MM (TROCAR) IMPLANT
WARMER LAPAROSCOPE (MISCELLANEOUS) ×2 IMPLANT

## 2023-07-09 NOTE — Discharge Instructions (Addendum)
  Post Anesthesia Home Care Instructions  Activity: Get plenty of rest for the remainder of the day. A responsible individual must stay with you for 24 hours following the procedure.  For the next 24 hours, DO NOT: -Drive a car -Advertising copywriter -Drink alcoholic beverages -Take any medication unless instructed by your physician -Make any legal decisions or sign important papers.  Meals: Start with liquid foods such as gelatin or soup. Progress to regular foods as tolerated. Avoid greasy, spicy, heavy foods. If nausea and/or vomiting occur, drink only clear liquids until the nausea and/or vomiting subsides. Call your physician if vomiting continues.  Special Instructions/Symptoms: Your throat may feel dry or sore from the anesthesia or the breathing tube placed in your throat during surgery. If this causes discomfort, gargle with warm salt water. The discomfort should disappear within 24 hours.    No Tylenol until after 2:00 p.m.  no ibuprofen or NSAID until 4:30 p.m.

## 2023-07-09 NOTE — H&P (Signed)
Brittany Morrow is an 37 y.o. female. LTL.  Pertinent Gynecological History: Menses: flow is moderate Bleeding: na Contraception: OCP (estrogen/progesterone) DES exposure: denies Blood transfusions: none Sexually transmitted diseases: no past history Previous GYN Procedures:  na   Last mammogram: normal Date: na Last pap: normal Date: 2024 OB History: G2, P2   Menstrual History: Menarche age: 52 No LMP recorded.    Past Medical History:  Diagnosis Date   Anemia    B12 deficiency    Dyshidrotic eczema    Environmental and seasonal allergies    Family history of adverse reaction to anesthesia    mother---  causes panic attacks   History of cervical dysplasia    CIN 1 Laser vaporization vagina 2008   History of chlamydia    2002, 2005   History of gonorrhea 2014   History of vaginal dysplasia    s/p  laser vaporization VAIN 1 in 2008   Hypothyroidism    followed by pcp   Irritable bowel syndrome with both constipation and diarrhea 07/06/2020   Seasonal allergic rhinitis    Vitamin D deficiency     Past Surgical History:  Procedure Laterality Date   ABDOMINOPLASTY  2018   CHOLECYSTECTOMY, LAPAROSCOPIC  05/17/2009   @ [facility] by dr Donell Beers   LASER VAPORIZATION OF CERVIX AND VAGINA  08/30/2007   @[facility]   by dr Shela Commons. Brittany Morrow;  CIN 1  and VAIN 1   REDUCTION MAMMAPLASTY Bilateral 2010    Family History  Problem Relation Age of Onset   Hypertension Mother    Hyperlipidemia Mother    Diabetes Father    Alcohol abuse Father    Diabetes Paternal Grandmother    Healthy Daughter     Social History:  reports that she has never smoked. She has never used smokeless tobacco. She reports current alcohol use. She reports that she does not use drugs.  Allergies:  Allergies  Allergen Reactions   Sulfa Antibiotics Palpitations   Tramadol Itching    No medications prior to admission.    Review of Systems  Constitutional: Negative.   All other systems reviewed and are  negative.   Height 5\' 3"  (1.6 m), weight 81.6 kg, not currently breastfeeding. Physical Exam Constitutional:      Appearance: Normal appearance.  HENT:     Head: Normocephalic and atraumatic.  Cardiovascular:     Rate and Rhythm: Normal rate and regular rhythm.     Pulses: Normal pulses.     Heart sounds: Normal heart sounds.  Pulmonary:     Breath sounds: Normal breath sounds.  Abdominal:     General: Bowel sounds are normal.     Palpations: Abdomen is soft.  Genitourinary:    General: Normal vulva.  Musculoskeletal:        General: Normal range of motion.     Cervical back: Normal range of motion and neck supple.  Skin:    General: Skin is warm and dry.  Neurological:     General: No focal deficit present.     Mental Status: She is alert and oriented to person, place, and time.  Psychiatric:        Mood and Affect: Mood normal.        Behavior: Behavior normal.     No results found for this or any previous visit (from the past 24 hour(s)).  No results found.  Assessment/Plan: LTL-declines total salpingectomy. JAMA data discussed. No fhx Ovarian CA.  Surgical risks discussed. Risks of anesthesia ,  infection, bleeding and injury to surrounding organs with need for repair noted. Failure risk up to 04/999 noted. Consent done.  Brittany Morrow J 07/09/2023, 7:03 AM

## 2023-07-09 NOTE — Anesthesia Procedure Notes (Signed)
Procedure Name: Intubation Date/Time: 07/09/2023 9:55 AM  Performed by: Jessica Priest, CRNAPre-anesthesia Checklist: Patient identified, Emergency Drugs available, Suction available, Patient being monitored and Timeout performed Patient Re-evaluated:Patient Re-evaluated prior to induction Oxygen Delivery Method: Circle system utilized Preoxygenation: Pre-oxygenation with 100% oxygen Induction Type: IV induction Ventilation: Mask ventilation without difficulty Laryngoscope Size: Mac and 3 Grade View: Grade II Tube type: Oral Tube size: 7.0 mm Number of attempts: 1 Airway Equipment and Method: Stylet and Oral airway Placement Confirmation: ETT inserted through vocal cords under direct vision, positive ETCO2, breath sounds checked- equal and bilateral and CO2 detector Secured at: 21 cm Tube secured with: Tape Dental Injury: Teeth and Oropharynx as per pre-operative assessment

## 2023-07-09 NOTE — Op Note (Signed)
NAME: Brittany Morrow, [place] MEDICAL RECORD NO: [id number] ACCOUNT NO: [id number] DATE OF BIRTH: Jan 11, 1986 FACILITY: [facility] LOCATION: [facility] PHYSICIAN: Lenoard Aden, MD  Operative Report   DATE OF PROCEDURE: 07/09/2023  PREOPERATIVE DIAGNOSIS:  Desires sterilization.  Declines bilateral total salpingectomy.  JAMA network data and ACOG recommendations discussed.  POSTOPERATIVE DIAGNOSIS:  Desires sterilization.  Declines bilateral total salpingectomy.  JAMA network data and ACOG recommendations discussed.  PROCEDURE:  Laparoscopic tubal ligation, bipolar cautery.  SURGEON:  Lenoard Aden, MD  ASSISTANT:  None.  ANESTHESIA:  Local, general.  ESTIMATED BLOOD LOSS:  5 mL  COMPLICATIONS:  None.  DRAINS:  None.  COUNTS: Correct.  SPECIMENS:  None.  DISPOSITION: Patient recovery in good condition.   BRIEF OPERATIVE NOTE: After being apprised of the risks of anesthesia, infection, bleeding, and surrounding organs, possible need for repair, delayed versus immediate complications including bowel and bladder injury, possible need for repair, possible  decrease incidence of ovarian cancer somewhat decreased, not quite as prominent as with total salpingectomy.  The patient consents were signed. He was brought to the operating room without difficulty.  She was put under general anesthetic, prepped and  draped in usual sterile fashion, catheterized until the bladder was empty.  Feet are placed in Yellofin stirrups.  Exam under anesthesia reveals an anteflexed to normal sized uterus and no adnexal masses.  Hulka tenaculum placed vaginally without  difficulty.  At this time, previously reconstructed umbilicus was used due to the abdominoplasty.  A small infraumbilical incision was made.  Veress needle placed, opening pressure of 2.  3 liters CO2 insufflated without difficulty.  Trocar placed  atraumatically.  Visualization reveals absent gallbladder.  Appendix not visualized.  Normal  pelvis.  Anterior, posterior cul-de-sac, bilateral normal adnexa.  Left tube traced out to fimbriated end.  Three contiguous areas of the ampullary isthmic  portion of the tube are cauterized to a resistance of 0. Tube was divided. Lumens were visualized.  Good result was noted.  Same procedure done on the right tube.  Bilateral normal ovaries were noted. At this time, CO2 was released.  All instruments were  removed under direct visualization.  Positive pressure applied. Infiltration of 20 mL of dilute Marcaine solution done. Closure with 0 Vicryl, 4-0 Vicryl and Dermabond.  Instruments were removed from the vagina.  The patient tolerated the procedure  well, transferred to recovery in good condition.      PAA D: 07/09/2023 10:40:40 am T: 07/09/2023 11:22:00 am  JOB: [id number]/ [id number]

## 2023-07-09 NOTE — Op Note (Signed)
07/09/2023  10:36 AM  PATIENT:  Brittany Morrow  37 y.o. female  PRE-OPERATIVE DIAGNOSIS:  Desires Sterilization-declines bilateral total salpingectomy  POST-OPERATIVE DIAGNOSIS:  Desires Sterilization  PROCEDURE:  Procedure(s): LAPAROSCOPIC TUBAL LIGATION WITH BIPOLAR CAUTERY  SURGEON:  Surgeon(s): Olivia Mackie, MD  ASSISTANTS: none   ANESTHESIA:   local and general  ESTIMATED BLOOD LOSS: 5 mL   DRAINS: none   LOCAL MEDICATIONS USED:  MARCAINE    and Amount: 20 ml  SPECIMEN:  No Specimen  DISPOSITION OF SPECIMEN:  N/A  COUNTS:  YES  DICTATION #: [id number]  PLAN OF CARE: DC HOME  PATIENT DISPOSITION:  PACU - hemodynamically stable.

## 2023-07-09 NOTE — Anesthesia Postprocedure Evaluation (Signed)
Anesthesia Post Note  Patient: Brittany Morrow  Procedure(s) Performed: LAPAROSCOPIC TUBAL LIGATION (Bilateral)     Patient location during evaluation: PACU Anesthesia Type: General Level of consciousness: awake and alert Pain management: pain level controlled Vital Signs Assessment: post-procedure vital signs reviewed and stable Respiratory status: spontaneous breathing, nonlabored ventilation, respiratory function stable and patient connected to nasal cannula oxygen Cardiovascular status: blood pressure returned to baseline and stable Postop Assessment: no apparent nausea or vomiting Anesthetic complications: no  No notable events documented.  Last Vitals:  Vitals:   07/09/23 1130 07/09/23 1151  BP: 118/71 120/79  Pulse: 80 72  Resp: (!) 26 16  Temp:  36.7 C  SpO2: 100% 100%    Last Pain:  Vitals:   07/09/23 1151  TempSrc:   PainSc: 5                  Kennieth Rad

## 2023-07-09 NOTE — Transfer of Care (Signed)
Immediate Anesthesia Transfer of Care Note  Patient: Brittany Morrow  Procedure(s) Performed: Procedure(s) (LRB): LAPAROSCOPIC TUBAL LIGATION (Bilateral)  Patient Location: PACU  Anesthesia Type: General  Level of Consciousness: awake, sedated, patient cooperative and responds to stimulation  Airway & Oxygen Therapy: Patient Spontanous Breathing and Patient connected to [place] oxygen  Post-op Assessment: Report given to PACU RN, Post -op Vital signs reviewed and stable and Patient moving all extremities  Post vital signs: Reviewed and stable  Complications: No apparent anesthesia complications

## 2023-07-09 NOTE — Anesthesia Preprocedure Evaluation (Signed)
Anesthesia Evaluation  Patient identified by MRN, date of birth, ID band Patient awake    Reviewed: Allergy & Precautions, NPO status , Patient's Chart, lab work & pertinent test results  Airway Mallampati: II  TM Distance: >3 FB Neck ROM: Full    Dental  (+) Dental Advisory Given   Pulmonary neg pulmonary ROS   breath sounds clear to auscultation       Cardiovascular negative cardio ROS  Rhythm:Regular Rate:Normal     Neuro/Psych negative neurological ROS     GI/Hepatic negative GI ROS, Neg liver ROS,,,  Endo/Other  Hypothyroidism    Renal/GU negative Renal ROS     Musculoskeletal   Abdominal   Peds  Hematology negative hematology ROS (+)   Anesthesia Other Findings   Reproductive/Obstetrics                             Anesthesia Physical Anesthesia Plan  ASA: 2  Anesthesia Plan: General   Post-op Pain Management: Tylenol PO (pre-op)* and Toradol IV (intra-op)*   Induction: Intravenous  PONV Risk Score and Plan: 3 and Midazolam, Dexamethasone, Ondansetron and Treatment may vary due to age or medical condition  Airway Management Planned: Oral ETT  Additional Equipment: None  Intra-op Plan:   Post-operative Plan: Extubation in OR  Informed Consent: I have reviewed the patients History and Physical, chart, labs and discussed the procedure including the risks, benefits and alternatives for the proposed anesthesia with the patient or authorized representative who has indicated his/her understanding and acceptance.     Dental advisory given  Plan Discussed with: CRNA  Anesthesia Plan Comments:        Anesthesia Quick Evaluation

## 2023-07-10 ENCOUNTER — Encounter (HOSPITAL_BASED_OUTPATIENT_CLINIC_OR_DEPARTMENT_OTHER): Payer: Self-pay | Admitting: Obstetrics and Gynecology

## 2023-08-19 ENCOUNTER — Other Ambulatory Visit (HOSPITAL_COMMUNITY): Payer: Self-pay

## 2023-10-10 ENCOUNTER — Other Ambulatory Visit: Payer: Self-pay

## 2023-10-29 ENCOUNTER — Encounter: Payer: Self-pay | Admitting: Nurse Practitioner

## 2023-10-29 ENCOUNTER — Ambulatory Visit (INDEPENDENT_AMBULATORY_CARE_PROVIDER_SITE_OTHER): Payer: 59 | Admitting: Nurse Practitioner

## 2023-10-29 VITALS — BP 110/76 | HR 86 | Temp 98.7°F

## 2023-10-29 DIAGNOSIS — J069 Acute upper respiratory infection, unspecified: Secondary | ICD-10-CM | POA: Diagnosis not present

## 2023-10-29 LAB — POCT INFLUENZA A/B
Influenza A, POC: NEGATIVE
Influenza B, POC: NEGATIVE

## 2023-10-29 LAB — POC COVID19 BINAXNOW: SARS Coronavirus 2 Ag: NEGATIVE

## 2023-10-29 NOTE — Progress Notes (Signed)
Acute Office Visit  Subjective:     Patient ID: Brittany Morrow, female    DOB: 1986-02-27, 37 y.o.   MRN: [id number]  Chief Complaint  Patient presents with   Sinus Drainage    Sputum is brownish, headache    HPI Patient is in today for sinus pain and congestion.   Discussed the use of AI scribe software for clinical note transcription with the patient, who gave verbal consent to proceed.  History of Present Illness   The patient presents with a one-day history of cold symptoms, including a runny nose and sore throat pain upon waking. She also reports drainage from one eye. She suspects these symptoms may be due to allergies. The patient denies experiencing ear pain, fever, coughing, or sinus pain or pressure. She does report shortness of breath when walking. She has been self-treating with Theraflu, which she reports has been helpful. The patient received a flu shot at the beginning of October and denies recent exposure to sick individuals. She expresses concern about her symptoms due to having a newborn at home and the ongoing COVID-19 pandemic.      ROS See pertinent positives and negatives per HPI.     Objective:    BP 110/76 (BP Location: Left Arm)   Pulse 86   Temp 98.7 F (37.1 C)   SpO2 97%    Physical Exam Vitals and nursing note reviewed.  Constitutional:      General: She is not in acute distress.    Appearance: Normal appearance.  HENT:     Head: Normocephalic.     Right Ear: Tympanic membrane, ear canal and external ear normal.     Left Ear: Tympanic membrane, ear canal and external ear normal.     Nose: Congestion present.     Right Sinus: No maxillary sinus tenderness or frontal sinus tenderness.     Left Sinus: No maxillary sinus tenderness or frontal sinus tenderness.     Mouth/Throat:     Mouth: Mucous membranes are moist.     Pharynx: Posterior oropharyngeal erythema present. No oropharyngeal exudate.  Eyes:     Conjunctiva/sclera:  Conjunctivae normal.  Cardiovascular:     Rate and Rhythm: Normal rate and regular rhythm.     Pulses: Normal pulses.     Heart sounds: Normal heart sounds.  Pulmonary:     Effort: Pulmonary effort is normal.     Breath sounds: Normal breath sounds.  Musculoskeletal:     Cervical back: Normal range of motion and neck supple. No tenderness.  Lymphadenopathy:     Cervical: No cervical adenopathy.  Skin:    General: Skin is warm.  Neurological:     General: No focal deficit present.     Mental Status: She is alert and oriented to person, place, and time.  Psychiatric:        Mood and Affect: Mood normal.        Behavior: Behavior normal.        Thought Content: Thought content normal.        Judgment: Judgment normal.     No results found for any visits on 10/29/23.      Assessment & Plan:      Upper Respiratory Symptoms Symptoms including rhinorrhea, sore throat, and eye drainage began yesterday, with no accompanying fever, ear pain, or sinus pressure. Tests for flu and COVID-19 returned negative results, suggesting a likely viral upper respiratory infection or allergies. For symptom management, she should continue  using Theraflu as needed. Additional recommendations for symptomatic relief include saline nasal rinses, hot showers, and hot tea. It is important for her to stay hydrated and rest. She should return if symptoms worsen or if sinus pain develops.      No orders of the defined types were placed in this encounter.   Return if symptoms worsen or fail to improve.  Gerre Scull, NP

## 2023-10-29 NOTE — Patient Instructions (Signed)
It was great to see you!  Flu and covid test negative  Keep drinking plenty of fluids and taking theraflu.   Let's follow-up if symptoms worsen or any concerns.   Take care,  Rodman Pickle, NP

## 2023-11-14 ENCOUNTER — Other Ambulatory Visit (HOSPITAL_COMMUNITY): Payer: Self-pay

## 2024-02-06 ENCOUNTER — Encounter: Payer: Self-pay | Admitting: Nurse Practitioner

## 2024-02-12 ENCOUNTER — Telehealth: Payer: Self-pay

## 2024-02-12 NOTE — Telephone Encounter (Signed)
Copied from CRM [id number]. Topic: General - Other >> Feb 11, 2024  4:30 PM Fredrich Romans wrote: Reason for CRM: [facility] called in stating that the documents requested from them they do not have.

## 2024-02-13 NOTE — Telephone Encounter (Signed)
Noted

## 2024-02-26 ENCOUNTER — Other Ambulatory Visit (HOSPITAL_COMMUNITY): Payer: Self-pay

## 2024-02-26 ENCOUNTER — Other Ambulatory Visit: Payer: Self-pay | Admitting: Nurse Practitioner

## 2024-02-26 DIAGNOSIS — E039 Hypothyroidism, unspecified: Secondary | ICD-10-CM

## 2024-02-26 MED ORDER — LEVOTHYROXINE SODIUM 88 MCG PO TABS
88.0000 ug | ORAL_TABLET | Freq: Every day | ORAL | 0 refills | Status: DC
Start: 2024-02-26 — End: 2024-05-20
  Filled 2024-02-26: qty 90, 90d supply, fill #0

## 2024-02-26 NOTE — Telephone Encounter (Signed)
 Copied from CRM [id number]. Topic: Clinical - Medication Refill >> Feb 26, 2024  9:32 AM Isabell A wrote: Most Recent Primary Care Visit:  Provider: Rodman Pickle A  Department: [facility]  Visit Type: SAME DAY  Date: 10/29/2023  Medication: levothyroxine (SYNTHROID) 88 MCG tablet  Has the patient contacted their pharmacy? No (Agent: If no, request that the patient contact the pharmacy for the refill. If patient does not wish to contact the pharmacy document the reason why and proceed with request.) (Agent: If yes, when and what did the pharmacy advise?)  Is this the correct pharmacy for this prescription? Yes If no, delete pharmacy and type the correct one.  This is the patient's preferred pharmacy:  [facility] [address] [place] [address] Phone: [phone] Fax: [phone]  Has the prescription been filled recently? Yes  Is the patient out of the medication? Yes  Has the patient been seen for an appointment in the last year OR does the patient have an upcoming appointment? Yes  Can we respond through MyChart? No  Agent: Please be advised that Rx refills may take up to 3 business days. We ask that you follow-up with your pharmacy.

## 2024-02-26 NOTE — Telephone Encounter (Signed)
 Requesting: levothyroxine (SYNTHROID) 88 MCG tablet  Last Visit: 10/29/2023 Next Visit: 04/13/2024 Last Refill: 11/28/2022  Please Advise

## 2024-02-27 ENCOUNTER — Other Ambulatory Visit (HOSPITAL_COMMUNITY): Payer: Self-pay

## 2024-02-27 DIAGNOSIS — N898 Other specified noninflammatory disorders of vagina: Secondary | ICD-10-CM | POA: Diagnosis not present

## 2024-02-27 DIAGNOSIS — N760 Acute vaginitis: Secondary | ICD-10-CM | POA: Diagnosis not present

## 2024-02-27 DIAGNOSIS — B3731 Acute candidiasis of vulva and vagina: Secondary | ICD-10-CM | POA: Diagnosis not present

## 2024-02-27 MED ORDER — METRONIDAZOLE 0.75 % VA GEL
1.0000 | Freq: Every day | VAGINAL | 0 refills | Status: DC
  Filled 2024-02-27: qty 70, 5d supply, fill #0

## 2024-02-27 MED ORDER — FLUCONAZOLE 150 MG PO TABS
150.0000 mg | ORAL_TABLET | ORAL | 0 refills | Status: AC
  Filled 2024-02-27: qty 1, 1d supply, fill #0

## 2024-03-08 ENCOUNTER — Other Ambulatory Visit (HOSPITAL_COMMUNITY): Payer: Self-pay

## 2024-03-08 MED ORDER — FLUCONAZOLE 150 MG PO TABS
ORAL_TABLET | ORAL | 0 refills | Status: DC
  Filled 2024-03-08: qty 1, 1d supply, fill #0

## 2024-04-01 ENCOUNTER — Other Ambulatory Visit (HOSPITAL_COMMUNITY): Payer: Self-pay

## 2024-04-01 DIAGNOSIS — Z01411 Encounter for gynecological examination (general) (routine) with abnormal findings: Secondary | ICD-10-CM | POA: Diagnosis not present

## 2024-04-01 DIAGNOSIS — Z1389 Encounter for screening for other disorder: Secondary | ICD-10-CM | POA: Diagnosis not present

## 2024-04-01 DIAGNOSIS — Z9079 Acquired absence of other genital organ(s): Secondary | ICD-10-CM | POA: Diagnosis not present

## 2024-04-01 DIAGNOSIS — Z8742 Personal history of other diseases of the female genital tract: Secondary | ICD-10-CM | POA: Diagnosis not present

## 2024-04-01 DIAGNOSIS — Z130 Encounter for screening for diseases of the blood and blood-forming organs and certain disorders involving the immune mechanism: Secondary | ICD-10-CM | POA: Diagnosis not present

## 2024-04-01 DIAGNOSIS — N760 Acute vaginitis: Secondary | ICD-10-CM | POA: Diagnosis not present

## 2024-04-01 MED ORDER — METRONIDAZOLE 0.75 % VA GEL
VAGINAL | 0 refills | Status: DC
  Filled 2024-04-01: qty 70, 5d supply, fill #0

## 2024-04-05 LAB — LAB REPORT - SCANNED: Chlamydia, Swab/Urine, PCR: NEGATIVE

## 2024-04-06 ENCOUNTER — Other Ambulatory Visit (HOSPITAL_COMMUNITY): Payer: Self-pay

## 2024-04-06 ENCOUNTER — Encounter (HOSPITAL_COMMUNITY): Payer: Self-pay | Admitting: Pharmacist

## 2024-04-06 MED ORDER — BORIC ACID VAGINAL 600 MG VA SUPP: VAGINAL | 0 refills | Status: DC

## 2024-04-06 MED ORDER — METRONIDAZOLE 500 MG PO TABS
500.0000 mg | ORAL_TABLET | Freq: Two times a day (BID) | ORAL | 0 refills | Status: DC
  Filled 2024-04-06 – 2024-04-07 (×2): qty 28, 14d supply, fill #0

## 2024-04-07 ENCOUNTER — Other Ambulatory Visit (HOSPITAL_COMMUNITY): Payer: Self-pay

## 2024-04-13 ENCOUNTER — Encounter: Payer: Self-pay | Admitting: Nurse Practitioner

## 2024-04-13 ENCOUNTER — Ambulatory Visit (INDEPENDENT_AMBULATORY_CARE_PROVIDER_SITE_OTHER): Payer: Commercial Managed Care - PPO | Admitting: Nurse Practitioner

## 2024-04-13 VITALS — BP 110/82 | HR 76 | Temp 97.6°F | Ht 63.0 in | Wt 190.4 lb

## 2024-04-13 DIAGNOSIS — Z0000 Encounter for general adult medical examination without abnormal findings: Secondary | ICD-10-CM | POA: Diagnosis not present

## 2024-04-13 DIAGNOSIS — E538 Deficiency of other specified B group vitamins: Secondary | ICD-10-CM | POA: Diagnosis not present

## 2024-04-13 DIAGNOSIS — E039 Hypothyroidism, unspecified: Secondary | ICD-10-CM

## 2024-04-13 DIAGNOSIS — E559 Vitamin D deficiency, unspecified: Secondary | ICD-10-CM | POA: Diagnosis not present

## 2024-04-13 DIAGNOSIS — E66811 Obesity, class 1: Secondary | ICD-10-CM | POA: Diagnosis not present

## 2024-04-13 DIAGNOSIS — Z136 Encounter for screening for cardiovascular disorders: Secondary | ICD-10-CM | POA: Diagnosis not present

## 2024-04-13 DIAGNOSIS — R7301 Impaired fasting glucose: Secondary | ICD-10-CM

## 2024-04-13 LAB — LIPID PANEL
Cholesterol: 170 mg/dL (ref 0–200)
HDL: 50.9 mg/dL (ref >39.00)
LDL Cholesterol: 96 mg/dL (ref 0–99)
NonHDL: 119.2
Total CHOL/HDL Ratio: 3
Triglycerides: 117 mg/dL (ref 0.0–149.0)
VLDL: 23.4 mg/dL (ref 0.0–40.0)

## 2024-04-13 LAB — COMPREHENSIVE METABOLIC PANEL WITH GFR
ALT: 13 U/L (ref 0–35)
AST: 15 U/L (ref 0–37)
Albumin: 4.5 g/dL (ref 3.5–5.2)
Alkaline Phosphatase: 68 U/L (ref 39–117)
BUN: 9 mg/dL (ref 6–23)
CO2: 25 meq/L (ref 19–32)
Calcium: 8.8 mg/dL (ref 8.4–10.5)
Chloride: 105 meq/L (ref 96–112)
Creatinine, Ser: 0.55 mg/dL (ref 0.40–1.20)
GFR: 117.04 mL/min (ref >60.00)
Glucose, Bld: 87 mg/dL (ref 70–99)
Potassium: 4.2 meq/L (ref 3.5–5.1)
Sodium: 137 meq/L (ref 135–145)
Total Bilirubin: 0.9 mg/dL (ref 0.2–1.2)
Total Protein: 7.1 g/dL (ref 6.0–8.3)

## 2024-04-13 LAB — CBC WITH DIFFERENTIAL/PLATELET
Basophils Absolute: 0 10*3/uL (ref 0.0–0.1)
Basophils Relative: 0.4 % (ref 0.0–3.0)
Eosinophils Absolute: 0.1 10*3/uL (ref 0.0–0.7)
Eosinophils Relative: 2 % (ref 0.0–5.0)
HCT: 39.5 % (ref 36.0–46.0)
Hemoglobin: 13 g/dL (ref 12.0–15.0)
Lymphocytes Relative: 23.9 % (ref 12.0–46.0)
Lymphs Abs: 1.7 10*3/uL (ref 0.7–4.0)
MCHC: 32.9 g/dL (ref 30.0–36.0)
MCV: 85.2 fl (ref 78.0–100.0)
Monocytes Absolute: 0.5 10*3/uL (ref 0.1–1.0)
Monocytes Relative: 7 % (ref 3.0–12.0)
Neutro Abs: 4.7 10*3/uL (ref 1.4–7.7)
Neutrophils Relative %: 66.7 % (ref 43.0–77.0)
Platelets: 225 10*3/uL (ref 150.0–400.0)
RBC: 4.63 Mil/uL (ref 3.87–5.11)
RDW: 13.5 % (ref 11.5–15.5)
WBC: 7.1 10*3/uL (ref 4.0–10.5)

## 2024-04-13 LAB — VITAMIN D 25 HYDROXY (VIT D DEFICIENCY, FRACTURES): VITD: 47.22 ng/mL (ref 30.00–100.00)

## 2024-04-13 LAB — HEMOGLOBIN A1C: Hgb A1c MFr Bld: 5.4 % (ref 4.6–6.5)

## 2024-04-13 LAB — VITAMIN B12: Vitamin B-12: 239 pg/mL (ref 211–911)

## 2024-04-13 LAB — TSH: TSH: 5.1 u[IU]/mL (ref 0.35–5.50)

## 2024-04-13 NOTE — Assessment & Plan Note (Signed)
 Chronic, stable. Will continue levothyroxine 88mcg daily. Check TSH today and adjust regimen based on results.

## 2024-04-13 NOTE — Assessment & Plan Note (Signed)
Health maintenance reviewed and updated. Discussed nutrition, exercise. Check CMP, CBC today. Follow-up 1 year.   

## 2024-04-13 NOTE — Assessment & Plan Note (Signed)
 BMI 33.7. Discussed nutrition, exercise.

## 2024-04-13 NOTE — Assessment & Plan Note (Signed)
 She is taking a daily supplement. Check vitamin D levels and adjust regimen based on results.

## 2024-04-13 NOTE — Assessment & Plan Note (Signed)
Check B12 levels today and treat based on results.

## 2024-04-13 NOTE — Progress Notes (Signed)
 BP 110/82 (BP Location: Left Arm, Patient Position: Sitting, Cuff Size: Normal)   Pulse 76   Temp 97.6 F (36.4 C)   Ht 5\' 3"  (1.6 m)   Wt 190 lb 6.4 oz (86.4 kg)   LMP 03/08/2024 (Exact Date)   SpO2 96%   Breastfeeding No   BMI 33.73 kg/m    Subjective:    Patient ID: Brittany Morrow, female    DOB: Jan 10, 1986, 38 y.o.   MRN: [id number]  CC: Chief Complaint  Patient presents with   Annual Exam    With fasting lab work, no concerns    HPI: Brittany Morrow is a 38 y.o. female presenting on 04/13/2024 for comprehensive medical examination. Current medical complaints include:none   She currently lives with: significant other, kids Menopausal Symptoms: no  Depression and Anxiety Screen done today and results listed below:     04/13/2024    8:45 AM 03/26/2023    5:04 PM 03/26/2023    4:21 PM 11/28/2022    8:51 AM 06/11/2022    7:25 AM  Depression screen PHQ 2/9  Decreased Interest 0 0 0 0 0  Down, Depressed, Hopeless 0 0 0 0 1  PHQ - 2 Score 0 0 0 0 1  Altered sleeping 0 0   1  Tired, decreased energy 0 0   3  Change in appetite 0 0   1  Feeling bad or failure about yourself  0 0   0  Trouble concentrating 0 0   0  Moving slowly or fidgety/restless 0 0   1  Suicidal thoughts 0 0   0  PHQ-9 Score 0 0   7  Difficult doing work/chores Not difficult at all Not difficult at all   Not difficult at all      04/13/2024    8:46 AM 03/26/2023    5:05 PM  GAD 7 : Generalized Anxiety Score  Nervous, Anxious, on Edge 0 0  Control/stop worrying 0 0  Worry too much - different things 0 0  Trouble relaxing 0 0  Restless 0 0  Easily annoyed or irritable 0 0  Afraid - awful might happen 0 0  Total GAD 7 Score 0 0  Anxiety Difficulty Not difficult at all Not difficult at all    The patient does not have a history of falls. I did not complete a risk assessment for falls. A plan of care for falls was not documented.   Past Medical History:  Past Medical History:  Diagnosis  Date   Anemia    B12 deficiency    Dyshidrotic eczema    Environmental and seasonal allergies    Family history of adverse reaction to anesthesia    mother---  causes panic attacks   History of cervical dysplasia    CIN 1 Laser vaporization vagina 2008   History of chlamydia    2002, 2005   History of gonorrhea 2014   History of vaginal dysplasia    s/p  laser vaporization VAIN 1 in 2008   Hypothyroidism    followed by pcp   Irritable bowel syndrome with both constipation and diarrhea 07/06/2020   Seasonal allergic rhinitis    Vitamin D deficiency     Surgical History:  Past Surgical History:  Procedure Laterality Date   ABDOMINOPLASTY  2018   CHOLECYSTECTOMY, LAPAROSCOPIC  05/17/2009   @ [facility] by dr Donell Beers   LAPAROSCOPIC TUBAL LIGATION Bilateral 07/09/2023   Procedure: LAPAROSCOPIC TUBAL LIGATION;  Surgeon: Olivia Mackie, MD;  Location[facility];  Service: Gynecology;  Laterality: Bilateral;  Requests 1hr.   LASER VAPORIZATION OF CERVIX AND VAGINA  08/30/2007   @[email or web address]   by dr Shela Commons. Lily Peer;  CIN 1  and VAIN 1   REDUCTION MAMMAPLASTY Bilateral 2010    Medications:  Current Outpatient Medications on File Prior to Visit  Medication Sig   albuterol (VENTOLIN HFA) 108 (90 Base) MCG/ACT inhaler Inhale 1 puff into the lungs every 6 (six) hours as needed for wheezing or shortness of breath.   budesonide (RHINOCORT AQUA) 32 MCG/ACT nasal spray Place 1 spray into both nostrils in the morning and at bedtime.   Cholecalciferol (VITAMIN D3 PO) Take 1 capsule by mouth daily.   fluticasone (FLONASE) 50 MCG/ACT nasal spray Place 2 sprays into both nostrils daily.   levothyroxine (SYNTHROID) 88 MCG tablet Take 1 tablet (88 mcg total) by mouth daily.   Multiple Vitamin (MULTIVITAMIN) tablet Take 1 tablet by mouth daily.   senna (SENOKOT) 8.6 MG tablet Take 1 tablet by mouth as needed for constipation.   Clobetasol Prop Emollient Base (CLOBETASOL PROPIONATE E) 0.05 % emollient  cream Apply 1 Application topically 2 (two) times daily. (Patient not taking: Reported on 04/13/2024)   No current facility-administered medications on file prior to visit.    Allergies:  Allergies  Allergen Reactions   Sulfa Antibiotics Palpitations   Tramadol Itching    Social History:  Social History   Socioeconomic History   Marital status: Significant Other    Spouse name: Not on file   Number of children: 1   Years of education: College   Highest education level: Not on file  Occupational History   Occupation: Teacher, 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[employer]    Comment: [employer]  Tobacco Use   Smoking status: Never   Smokeless tobacco: Never  Vaping Use   Vaping status: Never Used  Substance and Sexual Activity   Alcohol use: Yes    Comment: RARE-SOCIALLY ONLY   Drug use: Never   Sexual activity: Yes    Birth control/protection: Surgical  Other Topics Concern   Not on file  Social History Narrative   Not on file   Social Drivers of Health   Financial Resource Strain: Not on file  Food Insecurity: No Food Insecurity (05/13/2023)   Hunger Vital Sign    Worried About Running Out of Food in the Last Year: Never true    Ran Out of Food in the Last Year: Never true  Transportation Needs: No Transportation Needs (05/13/2023)   PRAPARE - Administrator, Civil Service (Medical): No    Lack of Transportation (Non-Medical): No  Physical Activity: Not on file  Stress: Not on file  Social Connections: Not on file  Intimate Partner Violence: Not At Risk (05/13/2023)   Humiliation, Afraid, Rape, and Kick questionnaire    Fear of Current or Ex-Partner: No    Emotionally Abused: No    Physically Abused: No    Sexually Abused: No   Social History   Tobacco Use  Smoking Status Never  Smokeless Tobacco Never   Social History   Substance and Sexual Activity  Alcohol Use Yes   Comment: RARE-SOCIALLY ONLY    Family History:  Family History  Problem Relation Age of Onset    Hypertension Mother    Hyperlipidemia Mother    Diabetes Father    Alcohol abuse Father    Diabetes Paternal Grandmother  Healthy Daughter     Past medical history, surgical history, medications, allergies, family history and social history reviewed with patient today and changes made to appropriate areas of the chart.   Review of Systems  Constitutional:  Positive for malaise/fatigue. Negative for fever.  HENT: Negative.    Eyes: Negative.   Respiratory: Negative.    Cardiovascular: Negative.   Gastrointestinal: Negative.   Genitourinary: Negative.   Musculoskeletal: Negative.   Skin: Negative.   Neurological: Negative.   Psychiatric/Behavioral: Negative.     All other ROS negative except what is listed above and in the HPI.      Objective:    BP 110/82 (BP Location: Left Arm, Patient Position: Sitting, Cuff Size: Normal)   Pulse 76   Temp 97.6 F (36.4 C)   Ht 5\' 3"  (1.6 m)   Wt 190 lb 6.4 oz (86.4 kg)   LMP 03/08/2024 (Exact Date)   SpO2 96%   Breastfeeding No   BMI 33.73 kg/m   Wt Readings from Last 3 Encounters:  04/13/24 190 lb 6.4 oz (86.4 kg)  07/09/23 183 lb 4.8 oz (83.1 kg)  05/13/23 209 lb (94.8 kg)    Physical Exam Vitals and nursing note reviewed.  Constitutional:      General: She is not in acute distress.    Appearance: Normal appearance.  HENT:     Head: Normocephalic and atraumatic.     Right Ear: Tympanic membrane, ear canal and external ear normal.     Left Ear: Tympanic membrane, ear canal and external ear normal.     Mouth/Throat:     Mouth: Mucous membranes are moist.     Pharynx: No posterior oropharyngeal erythema.  Eyes:     Conjunctiva/sclera: Conjunctivae normal.  Cardiovascular:     Rate and Rhythm: Normal rate and regular rhythm.     Pulses: Normal pulses.     Heart sounds: Normal heart sounds.  Pulmonary:     Effort: Pulmonary effort is normal.     Breath sounds: Normal breath sounds.  Abdominal:     Palpations:  Abdomen is soft.     Tenderness: There is no abdominal tenderness.  Musculoskeletal:        General: Normal range of motion.     Cervical back: Normal range of motion and neck supple.     Right lower leg: No edema.     Left lower leg: No edema.  Lymphadenopathy:     Cervical: No cervical adenopathy.  Skin:    General: Skin is warm and dry.  Neurological:     General: No focal deficit present.     Mental Status: She is alert and oriented to person, place, and time.     Cranial Nerves: No cranial nerve deficit.     Coordination: Coordination normal.     Gait: Gait normal.  Psychiatric:        Mood and Affect: Mood normal.        Behavior: Behavior normal.        Thought Content: Thought content normal.        Judgment: Judgment normal.     Results for orders placed or performed in visit on 04/13/24  HM PAP SMEAR   Collection Time: 06/25/23 12:00 AM  Result Value Ref Range   HM Pap smear normal       Assessment & Plan:   Problem List Items Addressed This Visit       Endocrine   Acquired hypothyroidism  Chronic, stable. Will continue levothyroxine 88mcg daily. Check TSH today and adjust regimen based on results.       Relevant Orders   TSH     Other   Obesity (BMI 30.0-34.9)   BMI 33.7. Discussed nutrition, exercise.       B12 deficiency due to diet   Check B12 levels today and treat based on results.       Relevant Orders   Vitamin B12   Vitamin D deficiency   She is taking a daily supplement. Check vitamin D levels and adjust regimen based on results.       Relevant Orders   VITAMIN D 25 Hydroxy (Vit-D Deficiency, Fractures)   Routine general medical examination at a health care facility - Primary   Health maintenance reviewed and updated. Discussed nutrition, exercise. Check CMP, CBC today. Follow-up 1 year.         Relevant Orders   CBC with Differential/Platelet   Comprehensive metabolic panel with GFR   Other Visit Diagnoses       IFG  (impaired fasting glucose)       Check A1c today   Relevant Orders   Hemoglobin A1c     Screening for cardiovascular condition       Screen lipid panel today.   Relevant Orders   Lipid panel        Follow up plan: Return in about 1 year (around 04/13/2025) for CPE.   LABORATORY TESTING:  - Pap smear: done elsewhere  IMMUNIZATIONS:   - Tdap: Tetanus vaccination status reviewed: last tetanus booster within 10 years. - Influenza: Postponed to flu season - Pneumovax: Not applicable - Prevnar: Not applicable - HPV: Up to date - Shingrix vaccine: Not applicable  SCREENING: -Mammogram: Not applicable  - Colonoscopy: Not applicable  - Bone Density: Not applicable   PATIENT COUNSELING:   Advised to take 1 mg of folate supplement per day if capable of pregnancy.   Sexuality: Discussed sexually transmitted diseases, partner selection, use of condoms, avoidance of unintended pregnancy  and contraceptive alternatives.   Advised to avoid cigarette smoking.  I discussed with the patient that most people either abstain from alcohol or drink within safe limits (<=14/week and <=4 drinks/occasion for males, <=7/weeks and <= 3 drinks/occasion for females) and that the risk for alcohol disorders and other health effects rises proportionally with the number of drinks per week and how often a drinker exceeds daily limits.  Discussed cessation/primary prevention of drug use and availability of treatment for abuse.   Diet: Encouraged to adjust caloric intake to maintain  or achieve ideal body weight, to reduce intake of dietary saturated fat and total fat, to limit sodium intake by avoiding high sodium foods and not adding table salt, and to maintain adequate dietary potassium and calcium preferably from fresh fruits, vegetables, and low-fat dairy products.    stressed the importance of regular exercise  Injury prevention: Discussed safety belts, safety helmets, smoke detector, smoking near  bedding or upholstery.   Dental health: Discussed importance of regular tooth brushing, flossing, and dental visits.    NEXT PREVENTATIVE PHYSICAL DUE IN 1 YEAR. Return in about 1 year (around 04/13/2025) for CPE.  Markiesha Delia A Jessyka Austria

## 2024-04-13 NOTE — Patient Instructions (Signed)
 It was great to see you!  We are checking your labs today and will let you know the results via mychart/phone.   Let me know if you need any refills  Let's follow-up in 1 year, sooner if you have concerns.  If a referral was placed today, you will be contacted for an appointment. Please note that routine referrals can sometimes take up to 3-4 weeks to process. Please call our office if you haven't heard anything after this time frame.  Take care,  Rodman Pickle, NP

## 2024-05-20 ENCOUNTER — Other Ambulatory Visit: Payer: Self-pay | Admitting: Nurse Practitioner

## 2024-05-20 DIAGNOSIS — E039 Hypothyroidism, unspecified: Secondary | ICD-10-CM

## 2024-05-21 ENCOUNTER — Other Ambulatory Visit (HOSPITAL_COMMUNITY): Payer: Self-pay

## 2024-05-21 MED ORDER — LEVOTHYROXINE SODIUM 88 MCG PO TABS
88.0000 ug | ORAL_TABLET | Freq: Every day | ORAL | 1 refills | Status: DC
  Filled 2024-05-21: qty 90, 90d supply, fill #0
  Filled 2024-08-25: qty 90, 90d supply, fill #1

## 2024-05-21 MED ORDER — FLUTICASONE PROPIONATE 50 MCG/ACT NA SUSP
2.0000 | Freq: Every day | NASAL | 1 refills | Status: AC
  Filled 2024-05-21: qty 16, 30d supply, fill #0

## 2024-05-21 NOTE — Telephone Encounter (Signed)
 Requesting: fluticasone  (FLONASE ) 50 MCG/ACT nasal spray , levothyroxine  (SYNTHROID ) 88 MCG tablet  Last Visit: 04/13/2024 Next Visit: Visit date not found Last Refill: 03/26/2023, 02/26/2024  Please Advise

## 2024-12-05 ENCOUNTER — Telehealth: Admitting: Physician Assistant

## 2024-12-05 ENCOUNTER — Encounter: Payer: Self-pay | Admitting: Physician Assistant

## 2024-12-05 ENCOUNTER — Other Ambulatory Visit: Payer: Self-pay | Admitting: Nurse Practitioner

## 2024-12-05 DIAGNOSIS — H00012 Hordeolum externum right lower eyelid: Secondary | ICD-10-CM | POA: Diagnosis not present

## 2024-12-05 DIAGNOSIS — E039 Hypothyroidism, unspecified: Secondary | ICD-10-CM

## 2024-12-05 MED ORDER — ERYTHROMYCIN 5 MG/GM OP OINT: 1.0000 | TOPICAL_OINTMENT | Freq: Every day | OPHTHALMIC | 0 refills | Status: AC

## 2024-12-05 NOTE — Progress Notes (Signed)
 We are sorry that you are not feeling well. Here is how we plan to help!  Based on what you have shared with me it looks like you have a stye.  A stye is an inflammation of the eyelid.  It is often a red, painful lump near the edge of the eyelid that may look like a boil or a pimple.  A stye develops when an infection occurs at the base of an eyelash.   We have made appropriate suggestions for you based upon your presentation: I have prescribed Erythromycin  Ophthalmic ointment 0.5% Apply topically in affected eye daily at bedtime for 5 days. To apply: Tilt the head back and, pressing your finger gently on the skin just beneath the lower eyelid, pull the lower eyelid away from the eye to make a space. Squeeze a thin strip of ointment into this space. A 1-cm (approximately 1/3-inch) strip of ointment is usually enough, unless you have been told by your doctor to use a different amount. Let go of the eyelid and gently close the eyes. Keep the eyes closed for 1 or 2 minutes to allow the medicine to come into contact with the infection.      HOME CARE:  Wash your hands often! Let the stye open on its own. Don't squeeze or open it. Don't rub your eyes. This can irritate your eyes and let in bacteria.  If you need to touch your eyes, wash your hands first. Don't wear eye makeup or contact lenses until the area has healed.  GET HELP RIGHT AWAY IF:  Your symptoms do not improve. You develop blurred or loss of vision. Your symptoms worsen (increased discharge, pain or redness).  Thank you for choosing an e-visit.  Your e-visit answers were reviewed by a board certified advanced clinical practitioner to complete your personal care plan.  Depending upon the condition, your plan could have included both over the counter or prescription medications.  Please review your pharmacy choice.  Make sure the pharmacy is open so you can pick up prescription now.  If there is a problem, you may contact your provider  through [employer] and have the prescription routed to another pharmacy.    Your safety is important to us .  If you have drug allergies check your prescription carefully.  For the next 24 hours you can use MyChart to ask questions about today's visit, request a non-urgent call back, or ask for a work or school excuse.  You will get an email in the next two days asking about your experience.  I hope you that your e-visit has been valuable and will speed your recovery.  I have spent 5 minutes in review of e-visit questionnaire, review and updating patient chart, medical decision making and response to patient.   Kirk RAMAN Mayers, PA-C

## 2024-12-06 ENCOUNTER — Other Ambulatory Visit (HOSPITAL_COMMUNITY): Payer: Self-pay

## 2024-12-06 MED ORDER — LEVOTHYROXINE SODIUM 88 MCG PO TABS
88.0000 ug | ORAL_TABLET | Freq: Every day | ORAL | 1 refills | Status: AC
  Filled 2024-12-06: qty 90, 90d supply, fill #0
  Filled 2024-12-13: qty 90, 90d supply, fill #1

## 2024-12-06 NOTE — Telephone Encounter (Signed)
 Requesting: levothyroxine  (SYNTHROID ) 88 MCG tablet  Last Visit: 04/13/2024 Next Visit: Visit date not found Last Refill: 05/21/2024  Please Advise

## 2024-12-13 ENCOUNTER — Telehealth: Payer: Self-pay

## 2024-12-13 ENCOUNTER — Other Ambulatory Visit (HOSPITAL_COMMUNITY): Payer: Self-pay

## 2024-12-13 NOTE — Telephone Encounter (Signed)
 Copied from CRM #[id number]. Topic: Clinical - Medication Prior Auth >> Dec 13, 2024  2:19 PM Drema MATSU wrote: Reason for CRM: [facility] stated that the manufactured for levothyroxine  was discontinued. Now they are under a new manufactuer which is called [employer] and is needing verbal Authorization of the change.

## 2024-12-13 NOTE — Telephone Encounter (Signed)
 I called and spoke with Daphne at [facility] and gave her verbal to change manufacturer.

## 2024-12-31 ENCOUNTER — Ambulatory Visit: Admitting: Family Medicine

## 2024-12-31 ENCOUNTER — Other Ambulatory Visit (HOSPITAL_BASED_OUTPATIENT_CLINIC_OR_DEPARTMENT_OTHER): Payer: Self-pay

## 2024-12-31 ENCOUNTER — Encounter: Payer: Self-pay | Admitting: Family Medicine

## 2024-12-31 VITALS — BP 109/72 | HR 90 | Temp 98.0°F | Ht 63.0 in | Wt 177.0 lb

## 2024-12-31 DIAGNOSIS — R051 Acute cough: Secondary | ICD-10-CM

## 2024-12-31 DIAGNOSIS — J069 Acute upper respiratory infection, unspecified: Secondary | ICD-10-CM

## 2024-12-31 DIAGNOSIS — R52 Pain, unspecified: Secondary | ICD-10-CM

## 2024-12-31 LAB — POCT INFLUENZA A/B
Influenza A, POC: NEGATIVE
Influenza B, POC: NEGATIVE

## 2024-12-31 LAB — POC COVID19 BINAXNOW: SARS Coronavirus 2 Ag: NEGATIVE

## 2024-12-31 MED ORDER — PROMETHAZINE-DM 6.25-15 MG/5ML PO SYRP
5.0000 mL | ORAL_SOLUTION | Freq: Four times a day (QID) | ORAL | 0 refills | Status: AC | PRN
  Filled 2024-12-31: qty 100, 5d supply, fill #0

## 2024-12-31 MED ORDER — BENZONATATE 200 MG PO CAPS
200.0000 mg | ORAL_CAPSULE | Freq: Two times a day (BID) | ORAL | 0 refills | Status: DC | PRN
  Filled 2024-12-31: qty 20, 10d supply, fill #0

## 2024-12-31 NOTE — Patient Instructions (Signed)
 Likely a viral upper respiratory infection  Continue supportive measures including rest, hydration, humidifier use, steam showers, warm compresses to sinuses, warm liquids with lemon and honey, and over-the-counter cough, cold, and analgesics as needed. If symptoms persist 8-10 days, become severe, or return after a few days of feeling better, then please follow-up for repeat evaluation to determine if antibiotics may be necessary. Sending in some cough medicine for you.  Recommend taking a home COVID/Flu test tomorrow.   Over the counter medications that may be helpful for symptoms:  Guaifenesin 1200 mg extended release tabs twice daily, with plenty of water For cough and congestion Brand name: Mucinex   Pseudoephedrine 30 mg, one or two tabs every 4 to 6 hours For sinus congestion Brand name: Sudafed You must get this from the pharmacy counter.  Oxymetazoline nasal spray each morning, one spray in each nostril, for NO MORE THAN 3 days  For nasal and sinus congestion Brand name: Afrin Saline nasal spray or Saline Nasal Irrigation (Netti Pot, etc) 3-5 times a day For nasal and sinus congestion Brand names: Ocean or AYR Fluticasone  nasal spray OR Mometasone nasal spray OR Triamcinolone  Acetonide nasal spray - follow directions on the packaging For nasal and sinus congestion Brand name: Flonase , Nasonex, Nasacort  Warm salt water gargles  For sore throat Every few hours as needed Alternate ibuprofen  400-600 mg and acetaminophen  1000 mg every 6 hours For fever, body aches, headache Brand names: Motrin  or Advil  and Tylenol  Dextromethorphan 12-hour cough version 30 mg every 12 hours  For cough Brand name: Delsym Stop all other cold medications for now (Nyquil, Dayquil, Tylenol  Cold, Theraflu, etc) and other non-prescription cough/cold preparations. Many of these have the same ingredients listed above and could cause an overdose of medication.   Herbal treatments that have been shown to  be helpful in some patients include: Vitamin C 1000 mg per day Zinc 100 mg per day Quercetin 25-500 mg twice a day Melatonin 5-10mg  at bedtime Honey Green Tea  General Instructions Allow your body to rest Drink PLENTY of fluids Typically, we are the most contagious 1-2 days before symptoms start through the first 2-3 days of most severe symptoms. Per CDC guidelines, you can return to school/work when symptoms have started to improve and you have been fever-free for 24 hours. However, recommend you continue extra precautions for the following 5 days (frequent hand hygiene, masking, covering coughs/sneezes, minimize exposure to immunocompromised individuals, etc).  If you develop severe shortness of breath, uncontrolled fevers, coughing up blood, confusion, chest pain, or signs of dehydration (such as significantly decreased urine amounts or dizziness with standing) please go to the nearest ER.

## 2024-12-31 NOTE — Progress Notes (Signed)
 "  Acute Office Visit  Subjective:  Patient ID: Brittany Morrow, female    DOB: 11/06/1986  Age: 39 y.o. MRN: [id number]  CC:  Chief Complaint  Patient presents with   flu like symptoms      HPI Brittany Morrow is here for flu-like symptoms.    Discussed the use of AI scribe software for clinical note transcription with the patient, who gave verbal consent to proceed.  History of Present Illness Brittany Morrow is a 39 year old female who presents with symptoms of an upper respiratory infection.  Symptoms began last night after exposure to her teenager, who has a severe cough. Initially, she experienced a burning sensation in her throat, which progressed to a scratchy feeling. Around 3 AM, she awoke with a pounding headache and a cough that occasionally induces nausea. She also notes a runny nose with clear discharge and body aches accompanied by chills.  She denies having a fever but mentions coughing up a small amount of sputum. She experiences sinus pain under her eyes and itchy ears. Her cough is primarily dry.. No wheezing, trouble breathing, nausea, vomiting, or diarrhea.        Past Medical History:  Diagnosis Date   Anemia    B12 deficiency    Dyshidrotic eczema    Environmental and seasonal allergies    Family history of adverse reaction to anesthesia    mother---  causes panic attacks   History of cervical dysplasia    CIN 1 Laser vaporization vagina 2008   History of chlamydia    2002, 2005   History of gonorrhea 2014   History of vaginal dysplasia    s/p  laser vaporization VAIN 1 in 2008   Hypothyroidism    followed by pcp   Irritable bowel syndrome with both constipation and diarrhea 07/06/2020   Seasonal allergic rhinitis    Vitamin D  deficiency     Past Surgical History:  Procedure Laterality Date   ABDOMINOPLASTY  2018   CHOLECYSTECTOMY, LAPAROSCOPIC  05/17/2009   @ [facility] by dr aron   LAPAROSCOPIC TUBAL LIGATION Bilateral 07/09/2023    Procedure: LAPAROSCOPIC TUBAL LIGATION;  Surgeon: Gorge Ade, MD;  Location: [facility];  Service: Gynecology;  Laterality: Bilateral;  Requests 1hr.   LASER VAPORIZATION OF CERVIX AND VAGINA  08/30/2007   @[facility]   by dr jinny. winfred;  CIN 1  and VAIN 1   REDUCTION MAMMAPLASTY Bilateral 2010    Family History  Problem Relation Age of Onset   Hypertension Mother    Hyperlipidemia Mother    Diabetes Father    Alcohol abuse Father    Diabetes Paternal Grandmother    Healthy Daughter     Social History   Socioeconomic History   Marital status: Significant Other    Spouse name: Not on file   Number of children: 1   Years of education: College   Highest education level: Not on file  Occupational History   Occupation: Teacher, Adult Education: [employer]    Comment: Neurol[employer]  Tobacco Use   Smoking status: Never   Smokeless tobacco: Never  Vaping Use   Vaping status: Never Used  Substance and Sexual Activity   Alcohol use: Yes    Comment: RARE-SOCIALLY ONLY   Drug use: Never   Sexual activity: Yes    Birth control/protection: Surgical  Other Topics Concern   Not on file  Social History Narrative   Not on file  Social Drivers of Health   Tobacco Use: Low Risk (12/31/2024)   Patient History    Smoking Tobacco Use: Never    Smokeless Tobacco Use: Never    Passive Exposure: Not on file  Financial Resource Strain: Not on file  Food Insecurity: No Food Insecurity (05/13/2023)   Hunger Vital Sign    Worried About Running Out of Food in the Last Year: Never true    Ran Out of Food in the Last Year: Never true  Transportation Needs: No Transportation Needs (05/13/2023)   PRAPARE - Administrator, Civil Service (Medical): No    Lack of Transportation (Non-Medical): No  Physical Activity: Not on file  Stress: Not on file  Social Connections: Not on file  Intimate Partner Violence: Not At Risk (05/13/2023)   Humiliation, Afraid, Rape, and Kick  questionnaire    Fear of Current or Ex-Partner: No    Emotionally Abused: No    Physically Abused: No    Sexually Abused: No  Depression (PHQ2-9): Low Risk (12/31/2024)   Depression (PHQ2-9)    PHQ-2 Score: 0  Alcohol Screen: Not on file  Housing: Low Risk (05/13/2023)   Housing    Last Housing Risk Score: 0  Utilities: Not At Risk (05/13/2023)   [facility] Utilities    Threatened with loss of utilities: No  Health Literacy: Not on file    ROS All ROS negative except what is listed in the HPI.   Objective:   Today's Vitals: BP 109/72   Pulse 90   Temp 98 F (36.7 C) (Oral)   Ht 5' 3 (1.6 m)   Wt 177 lb (80.3 kg)   SpO2 100%   BMI 31.35 kg/m   Physical Exam Vitals reviewed.  Constitutional:      General: She is not in acute distress.    Appearance: Normal appearance. She is ill-appearing.  HENT:     Head: Normocephalic and atraumatic.     Right Ear: Tympanic membrane normal.     Left Ear: Tympanic membrane normal.     Nose: Congestion and rhinorrhea present.     Mouth/Throat:     Pharynx: No oropharyngeal exudate or posterior oropharyngeal erythema.  Eyes:     Conjunctiva/sclera: Conjunctivae normal.  Cardiovascular:     Rate and Rhythm: Normal rate and regular rhythm.     Heart sounds: Normal heart sounds.  Pulmonary:     Effort: Pulmonary effort is normal.     Breath sounds: Normal breath sounds. No wheezing, rhonchi or rales.  Musculoskeletal:     Cervical back: Normal range of motion.  Lymphadenopathy:     Cervical: No cervical adenopathy.  Skin:    General: Skin is warm and dry.  Neurological:     Mental Status: She is alert and oriented to person, place, and time.  Psychiatric:        Mood and Affect: Mood normal.        Behavior: Behavior normal.        Thought Content: Thought content normal.        Judgment: Judgment normal.         Assessment & Plan:   Problem List Items Addressed This Visit   None Visit Diagnoses       Viral URI    -   Primary   Relevant Medications   benzonatate  (TESSALON ) 200 MG capsule   promethazine-dextromethorphan (PROMETHAZINE-DM) 6.25-15 MG/5ML syrup     Body aches  Relevant Orders   POC COVID-19 BinaxNow (Completed)   POCT Influenza A/B (Completed)     Acute cough       Relevant Orders   POC COVID-19 BinaxNow (Completed)   POCT Influenza A/B (Completed)      COVID and Flu tests negative today  Likely a viral upper respiratory infection  Continue supportive measures including rest, hydration, humidifier use, steam showers, warm compresses to sinuses, warm liquids with lemon and honey, and over-the-counter cough, cold, and analgesics as needed. If symptoms persist 8-10 days, become severe, or return after a few days of feeling better, then please follow-up for repeat evaluation to determine if antibiotics may be necessary. Sending in some cough medicine for you.  Recommend taking a home COVID/Flu test tomorrow.      Follow-up: Return if symptoms worsen or fail to improve.   Waddell FURY Almarie, DNP, FNP-C  I,Emily Lagle,acting as a neurosurgeon for Waddell KATHEE Almarie, NP.,have documented all relevant documentation on the behalf of Waddell KATHEE Almarie, NP.   I, Waddell KATHEE Almarie, NP, have reviewed all documentation for this visit. The documentation on 12/31/2024 for the exam, diagnosis, procedures, and orders are all accurate and complete. "

## 2025-01-13 ENCOUNTER — Telehealth: Payer: Self-pay

## 2025-01-13 NOTE — Telephone Encounter (Signed)
 I called and spoke with patient and notified her that she will need to be seen. An appointment was scheduled for 01/26/25 at 320pm.

## 2025-01-13 NOTE — Telephone Encounter (Signed)
 Copied from CRM #[id number]. Topic: Clinical - Medication Question >> Jan 13, 2025  9:31 AM Deleta RAMAN wrote: Reason for CRM: patient would like to know if she can get wegoovy pills prescribed to her. Please contact [phone]

## 2025-01-21 ENCOUNTER — Ambulatory Visit: Admitting: Nurse Practitioner

## 2025-01-21 ENCOUNTER — Encounter: Payer: Self-pay | Admitting: Nurse Practitioner

## 2025-01-21 VITALS — BP 108/78 | HR 91 | Temp 97.3°F | Ht 63.0 in | Wt 177.8 lb

## 2025-01-21 DIAGNOSIS — E66811 Obesity, class 1: Secondary | ICD-10-CM | POA: Diagnosis not present

## 2025-01-21 DIAGNOSIS — E039 Hypothyroidism, unspecified: Secondary | ICD-10-CM

## 2025-01-21 MED ORDER — WEGOVY 1.5 MG PO TABS: 1.5000 mg | ORAL_TABLET | Freq: Every day | ORAL | 0 refills | Status: AC

## 2025-01-21 NOTE — Assessment & Plan Note (Signed)
 Her condition is managed with levothyroxine . Timing with Wegovy  is crucial for efficacy. Continue levothyroxine  88mcg daily. Ensure levothyroxine  is taken 30 minutes after Wegovy  and 30 minutes before eating or drinking.

## 2025-01-21 NOTE — Progress Notes (Signed)
 "   Established Patient Office Visit Subjective:    Patient ID: Brittany Morrow, female    DOB: 24-Apr-1986, 39 y.o.   MRN: [id number]  Chief Complaint Chief Complaint  Patient presents with   Weight Management    Wants to discuss options, no other concerns     HPI Brittany Morrow is a 39 year old who is here to discuss weight loss options.   Discussed the use of AI scribe software for clinical note transcription with the patient, who gave verbal consent to proceed.  Over the past 1.5 years she has lost weight from 205 lb to 170 lb but feels her progress has plateaued. She has reduced portion sizes, especially at dinner, avoids late-night eating, eats larger meals at lunch and dinner with minimal snacking, and attempts calorie tracking. She increased activity by walking more and using a stationary bike at least 20 minutes three times weekly but feels her weight loss has stalled. She is interested in medication to aid further weight loss, prefers to avoid injectables, and asks about an oral option similar to Wegovy . She has hypothyroidism and takes levothyroxine .      Review of Systems See pertinent positives and negatives per HPI.     Objective:    BP 108/78 (BP Location: Left Arm, Patient Position: Sitting, Cuff Size: Normal)   Pulse 91   Temp (!) 97.3 F (36.3 C)   Ht 5' 3 (1.6 m)   Wt 177 lb 12.8 oz (80.6 kg)   LMP 12/20/2024 (Exact Date)   SpO2 96%   Breastfeeding No   BMI 31.50 kg/m   BP Readings from Last 3 Encounters:  01/21/25 108/78  12/31/24 109/72  04/13/24 110/82   Wt Readings from Last 3 Encounters:  01/21/25 177 lb 12.8 oz (80.6 kg)  12/31/24 177 lb (80.3 kg)  04/13/24 190 lb 6.4 oz (86.4 kg)   Physical Exam Vitals and nursing note reviewed.  Constitutional:      General: She is not in acute distress.    Appearance: Normal appearance.  HENT:     Head: Normocephalic.  Eyes:     Conjunctiva/sclera: Conjunctivae normal.  Cardiovascular:     Rate and  Rhythm: Normal rate and regular rhythm.     Pulses: Normal pulses.     Heart sounds: Normal heart sounds.  Pulmonary:     Effort: Pulmonary effort is normal.     Breath sounds: Normal breath sounds.  Musculoskeletal:     Cervical back: Normal range of motion.  Skin:    General: Skin is warm.  Neurological:     General: No focal deficit present.     Mental Status: She is alert and oriented to person, place, and time.  Psychiatric:        Mood and Affect: Mood normal.        Behavior: Behavior normal.        Thought Content: Thought content normal.        Judgment: Judgment normal.       Assessment & Plan:   Problem List Items Addressed This Visit       Endocrine   Acquired hypothyroidism - Primary   Her condition is managed with levothyroxine . Timing with Wegovy  is crucial for efficacy. Continue levothyroxine  88mcg daily. Ensure levothyroxine  is taken 30 minutes after Wegovy  and 30 minutes before eating or drinking.         Other   Obesity (BMI 30.0-34.9)   BMI 31.5. Her weight has plateaued  at 170 lbs after losing 25 lbs. She is interested in oral pharmacological intervention. Wegovy  is considered, with potential side effects discussed. Alternative medications include Contrave  and Qsymia, though Qsymia is not preferred due to past adverse effects. Prescribe Wegovy  1.5 mg daily via [employer]. Instruct her to take Wegovy  in the morning on an empty stomach, wait 30 minutes, then take levothyroxine , wait another 30 minutes before eating or drinking. Advise her to drink plenty of water to mitigate side effects. Discuss potential side effects of Wegovy . Continue nutrition plan, limiting snacking and focusing on the plate method since tracking calories is challenging. 1/2 plate should be vegetables, 1/4 protein and 1/4 carbs. She will continue exercising, aiming to increase to 150 minutes per week. Follow-up in 4 weeks to assess response.      Relevant Medications    semaglutide -weight management (WEGOVY ) 1.5 MG tablet   Tinnie DELENA Harada, NP  I,Emily Lagle,acting as a scribe for [employer], NP.,have documented all relevant documentation on the behalf of Polo Mcmartin DELENA Harada, NP.  I, Tinnie DELENA Harada, NP, have reviewed all documentation for this visit. The documentation on 01/21/2025 for the exam, diagnosis, procedures, and orders are all accurate and complete. "

## 2025-01-21 NOTE — Patient Instructions (Signed)
 It was great to see you!  Continue to focus on nutrition and exercise   Start wegovy  1 tablet in the morning, wait 30 minutes, take your levothyroxine , wait 30 minutes and then you can eat/drink   Let's follow-up in 4 weeks, sooner if you have concerns.  If a referral was placed today, you will be contacted for an appointment. Please note that routine referrals can sometimes take up to 3-4 weeks to process. Please call our office if you haven't heard anything after this time frame.  Take care,  Tinnie Harada, NP

## 2025-01-21 NOTE — Assessment & Plan Note (Addendum)
 BMI 31.5. Her weight has plateaued at 170 lbs after losing 25 lbs. She is interested in oral pharmacological intervention. Wegovy  is considered, with potential side effects discussed. Alternative medications include Contrave  and Qsymia, though Qsymia is not preferred due to past adverse effects. Prescribe Wegovy  1.5 mg daily via [employer]. Instruct her to take Wegovy  in the morning on an empty stomach, wait 30 minutes, then take levothyroxine , wait another 30 minutes before eating or drinking. Advise her to drink plenty of water to mitigate side effects. Discuss potential side effects of Wegovy . Continue nutrition plan, limiting snacking and focusing on the plate method since tracking calories is challenging. 1/2 plate should be vegetables, 1/4 protein and 1/4 carbs. She will continue exercising, aiming to increase to 150 minutes per week. Follow-up in 4 weeks to assess response.

## 2025-01-26 ENCOUNTER — Ambulatory Visit: Admitting: Nurse Practitioner

## 2025-02-23 ENCOUNTER — Ambulatory Visit: Admitting: Nurse Practitioner
